# Patient Record
Sex: Female | Born: 2000 | Race: White | Hispanic: Yes | Marital: Single | Smoking: Former smoker
Health system: Southern US, Community
[De-identification: ages and names within clinical notes are randomized; demographics above are authoritative.]

## PROBLEM LIST (undated history)

## (undated) ENCOUNTER — Telehealth

## (undated) ENCOUNTER — Telehealth: Attending: Family | Primary: Family

## (undated) ENCOUNTER — Encounter

## (undated) ENCOUNTER — Ambulatory Visit

## (undated) ENCOUNTER — Telehealth
Attending: Student in an Organized Health Care Education/Training Program | Primary: Student in an Organized Health Care Education/Training Program

## (undated) ENCOUNTER — Encounter
Attending: Student in an Organized Health Care Education/Training Program | Primary: Student in an Organized Health Care Education/Training Program

## (undated) ENCOUNTER — Encounter: Attending: MS" | Primary: MS"

## (undated) ENCOUNTER — Ambulatory Visit: Payer: PRIVATE HEALTH INSURANCE

## (undated) ENCOUNTER — Ambulatory Visit: Attending: Physician Assistant | Primary: Physician Assistant

## (undated) ENCOUNTER — Ambulatory Visit: Payer: Medicaid (Managed Care) | Attending: Physician Assistant | Primary: Physician Assistant

## (undated) ENCOUNTER — Ambulatory Visit
Payer: Medicaid (Managed Care) | Attending: Student in an Organized Health Care Education/Training Program | Primary: Student in an Organized Health Care Education/Training Program

## (undated) ENCOUNTER — Ambulatory Visit
Payer: PRIVATE HEALTH INSURANCE | Attending: Student in an Organized Health Care Education/Training Program | Primary: Student in an Organized Health Care Education/Training Program

## (undated) ENCOUNTER — Ambulatory Visit: Payer: PRIVATE HEALTH INSURANCE | Attending: Surgery | Primary: Surgery

## (undated) ENCOUNTER — Ambulatory Visit: Attending: Pharmacist | Primary: Pharmacist

## (undated) ENCOUNTER — Encounter: Attending: Advanced Practice Midwife | Primary: Advanced Practice Midwife

## (undated) ENCOUNTER — Encounter: Attending: Pharmacist | Primary: Pharmacist

## (undated) ENCOUNTER — Ambulatory Visit: Payer: PRIVATE HEALTH INSURANCE | Attending: Registered" | Primary: Registered"

## (undated) ENCOUNTER — Encounter: Attending: Physician Assistant | Primary: Physician Assistant

## (undated) ENCOUNTER — Ambulatory Visit: Payer: MEDICAID

## (undated) ENCOUNTER — Ambulatory Visit: Payer: Medicaid (Managed Care)

## (undated) DIAGNOSIS — Z87440 Personal history of urinary (tract) infections: Secondary | ICD-10-CM

## (undated) DIAGNOSIS — Z862 Personal history of diseases of the blood and blood-forming organs and certain disorders involving the immune mechanism: Secondary | ICD-10-CM

## (undated) DIAGNOSIS — D649 Anemia, unspecified: Secondary | ICD-10-CM

## (undated) HISTORY — DX: Anemia, unspecified: D64.9

## (undated) HISTORY — PX: OTHER SURGICAL HISTORY: SHX169

## (undated) HISTORY — DX: Personal history of urinary (tract) infections: Z87.440

## (undated) HISTORY — PX: NECK SURGERY: SHX720

## (undated) HISTORY — DX: Personal history of diseases of the blood and blood-forming organs and certain disorders involving the immune mechanism: Z86.2

---
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
## 2019-01-11 ENCOUNTER — Other Ambulatory Visit: Payer: Self-pay

## 2019-01-11 ENCOUNTER — Ambulatory Visit (LOCAL_COMMUNITY_HEALTH_CENTER): Payer: Self-pay

## 2019-01-11 VITALS — BP 93/55 | Ht 63.0 in | Wt 163.0 lb

## 2019-01-11 DIAGNOSIS — Z3201 Encounter for pregnancy test, result positive: Secondary | ICD-10-CM

## 2019-01-11 LAB — PREGNANCY, URINE: Preg Test, Ur: POSITIVE — AB

## 2019-01-11 MED ORDER — PRENATAL VITAMIN 27-0.8 MG PO TABS: 1.0000 | ORAL_TABLET | Freq: Every day | ORAL | 0 refills | Status: AC

## 2019-01-11 NOTE — Progress Notes (Signed)
Client plans [facility], but unsure where will deliver baby and if delivers at [facility] unsure of practice. Also unsure of pharmacy of choice. PHQ-9 score = 19. Client reported several days of thinking she would be better off dead, but denied ever thinking about how she would make that happen. Given Milton Ferguson LCSW card and aware can call for appt and that no charge for appt. Dr. Ernestina Patches notified of above and in agreement with plan. Client states will try to come back this pm to complete preadmission paperwork and schedule MHC appt. Shona Needles, RN

## 2019-02-12 ENCOUNTER — Ambulatory Visit: Payer: Medicaid Other | Admitting: Advanced Practice Midwife

## 2019-02-12 ENCOUNTER — Other Ambulatory Visit: Payer: Self-pay

## 2019-02-12 VITALS — BP 102/57 | Temp 97.7°F | Wt 161.4 lb

## 2019-02-12 DIAGNOSIS — F329 Major depressive disorder, single episode, unspecified: Secondary | ICD-10-CM

## 2019-02-12 DIAGNOSIS — Z3401 Encounter for supervision of normal first pregnancy, first trimester: Secondary | ICD-10-CM

## 2019-02-12 DIAGNOSIS — Z3400 Encounter for supervision of normal first pregnancy, unspecified trimester: Secondary | ICD-10-CM | POA: Diagnosis not present

## 2019-02-12 DIAGNOSIS — F32A Depression, unspecified: Secondary | ICD-10-CM

## 2019-02-12 LAB — HIV ANTIBODY (ROUTINE TESTING W REFLEX): HIV 1&2 Ab, 4th Generation: NONREACTIVE

## 2019-02-12 LAB — URINALYSIS
Bilirubin, UA: NEGATIVE
Glucose, UA: NEGATIVE
Ketones, UA: NEGATIVE
Nitrite, UA: POSITIVE — AB
Protein,UA: NEGATIVE
Specific Gravity, UA: 1.02 (ref 1.005–1.030)
Urobilinogen, Ur: 0.2 mg/dL (ref 0.2–1.0)
pH, UA: 7 (ref 5.0–7.5)

## 2019-02-12 LAB — WET PREP FOR TRICH, YEAST, CLUE
Trichomonas Exam: NEGATIVE
Yeast Exam: NEGATIVE

## 2019-02-12 LAB — HEMOGLOBIN, FINGERSTICK: Hemoglobin: 11.2 g/dL (ref 11.1–15.9)

## 2019-02-12 NOTE — Progress Notes (Signed)
In for new ob; has PNV; initial labs today; c/o nausea with occ. Vomiting; phone interview intake completed 02/07/19 by Tawanna Solo, RN; Debera Lat, RN

## 2019-02-13 DIAGNOSIS — F32A Depression, unspecified: Secondary | ICD-10-CM | POA: Insufficient documentation

## 2019-02-13 DIAGNOSIS — F329 Major depressive disorder, single episode, unspecified: Secondary | ICD-10-CM | POA: Insufficient documentation

## 2019-02-13 NOTE — Progress Notes (Signed)
[facility] [address] [place] [id number] [phone]  INITIAL PRENATAL VISIT NOTE  Subjective:  Natalie Jenkins is a 18 y.o. G1P0 at 3169w3d being seen today to start prenatal care at the [facility].  She is currently monitored for the following issues for this low-risk pregnancy and does not have a problem list on file.  Patient reports no complaints.Contractions: Not present. Vag. Bleeding: None.  Movement: Absent. Denies leaking of fluid.   The following portions of the patient's history were reviewed and updated as appropriate: allergies, current medications, past family history, past medical history, past social history, past surgical history and problem list. Problem list updated.  Objective:   Vitals:   02/12/19 0924  BP: (!) 102/57  Temp: 97.7 F (36.5 C)  Weight: 161 lb 6.4 oz (73.2 kg)    Fetal Status: Fetal Heart Rate (bpm): 160 Fundal Height: 12 cm Movement: Absent     Physical Exam Constitutional:      Appearance: Normal appearance. She is normal weight.  HENT:     Head: Normocephalic and atraumatic.     Right Ear: External ear normal.     Left Ear: External ear normal.     Nose: Nose normal.     Mouth/Throat:     Mouth: Mucous membranes are moist.  Eyes:     Conjunctiva/sclera: Conjunctivae normal.  Neck:     Musculoskeletal: Neck supple.  Cardiovascular:     Rate and Rhythm: Normal rate and regular rhythm.  Pulmonary:     Effort: Pulmonary effort is normal.     Breath sounds: Normal breath sounds.  Abdominal:     General: Abdomen is flat.     Palpations: Abdomen is soft.     Tenderness: There is no abdominal tenderness.     Hernia: No hernia is present. There is no hernia in the left inguinal area or right inguinal area.     Comments: Soft abdomen without tenderness, fundus 3 FBaS, FHR=160  Genitourinary:    General: Normal vulva.     Exam  position: Lithotomy position.     Labia:        Right: No rash or lesion.        Left: No rash or lesion.      Vagina: Normal.     Cervix: Normal.     Uterus: Enlarged (enlarged 12 wks size).      Adnexa: Right adnexa normal and left adnexa normal.     Rectum: Normal.  Lymphadenopathy:     Lower Body: No right inguinal adenopathy. No left inguinal adenopathy.  Skin:    General: Skin is warm and dry.     Findings: Lesion (large scar at base of neck--lipoma removed 4 years ago in [place]) present.  Neurological:     Mental Status: She is alert and oriented to person, place, and time.  Psychiatric:        Mood and Affect: Mood normal.        Behavior: Behavior normal.     Assessment and Plan:  Pregnancy: G1P0 at 8869w3d. 18 yo SHF with FOB x 4 months and states pregnancy is "a surprise".  Living with FOB (who has an 98 yo child who lives with his mother) and pt came here from [place] 07/05/2017.  Working [employer]. LMP 11/25/2018.  Last ETOH 11/2018 (5 beers)  1. Encounter for supervision of normal first pregnancy in first trimester Pt c/o  N&V 1x/wk--last vomited yesterday.  Suggestions given. - Prenatal Profile with Varicella(282020) - HGB FRAC. W/SOLUBILITY - Lead, blood (adult age 42 yrs or greater) - QuantiFERON-TB Gold Plus - Urine Culture - Chlamydia/GC NAA, Confirmation - HIV [place] [facility] - 330076 Drug Screen - Hemoglobin, venipuncture - WET PREP FOR TRICH, YEAST, CLUE - Urinalysis - Hemoglobin, fingerstick  2. Depression, unspecified depression type PHq-9=13. +cry daily, decreased appetite, decreased sleep, +moody, decreased energy level, thoughts of not wanting to be here but neg SI/HI and no hx of.   Declines counseling with Milton Ferguson    Discussed overview of care and coordination with inpatient delivery practices including [facility], [facility], [facility] and [facility].   Reviewed Centering pregnancy as standard of care at [facility], oriented to room and showed video.  Based on EDD, plan for Cycle .    Preterm labor symptoms and general obstetric precautions including but not limited to vaginal bleeding, contractions, leaking of fluid and fetal movement were reviewed in detail with the patient.  Please refer to After Visit Summary for other counseling recommendations.   No follow-ups on file.  Future Appointments  Date Time Provider [place]  03/12/2019 10:00 AM [facility] PROVIDER [facility] None   Epic down from 0800-1400 on 02/12/19 so unable to document or access chart during visit.   Herbie Saxon, CNM

## 2019-02-14 ENCOUNTER — Encounter: Payer: Self-pay | Admitting: Advanced Practice Midwife

## 2019-02-14 DIAGNOSIS — O2340 Unspecified infection of urinary tract in pregnancy, unspecified trimester: Secondary | ICD-10-CM | POA: Insufficient documentation

## 2019-02-14 LAB — 789231 7+OXYCODONE-BUND
Amphetamines, Urine: NEGATIVE ng/mL
BENZODIAZ UR QL: NEGATIVE ng/mL
Barbiturate screen, urine: NEGATIVE ng/mL
Cannabinoid Quant, Ur: NEGATIVE ng/mL
Cocaine (Metab.): NEGATIVE ng/mL
OPIATE SCREEN URINE: NEGATIVE ng/mL
Oxycodone/Oxymorphone, Urine: NEGATIVE ng/mL
PCP Quant, Ur: NEGATIVE ng/mL

## 2019-02-14 LAB — URINE CULTURE

## 2019-02-15 ENCOUNTER — Other Ambulatory Visit: Payer: Self-pay

## 2019-02-15 ENCOUNTER — Ambulatory Visit: Payer: Medicaid Other | Admitting: Advanced Practice Midwife

## 2019-02-15 ENCOUNTER — Telehealth: Payer: Self-pay

## 2019-02-15 VITALS — BP 92/56 | Temp 98.6°F | Wt 159.0 lb

## 2019-02-15 DIAGNOSIS — Z283 Underimmunization status: Secondary | ICD-10-CM | POA: Insufficient documentation

## 2019-02-15 DIAGNOSIS — N3000 Acute cystitis without hematuria: Secondary | ICD-10-CM | POA: Diagnosis not present

## 2019-02-15 DIAGNOSIS — Z2839 Other underimmunization status: Secondary | ICD-10-CM | POA: Insufficient documentation

## 2019-02-15 DIAGNOSIS — O09899 Supervision of other high risk pregnancies, unspecified trimester: Secondary | ICD-10-CM | POA: Insufficient documentation

## 2019-02-15 DIAGNOSIS — Z3401 Encounter for supervision of normal first pregnancy, first trimester: Secondary | ICD-10-CM

## 2019-02-15 LAB — CBC/D/PLT+RPR+RH+ABO+RUB AB...
Antibody Screen: NEGATIVE
Basophils Absolute: 0.1 10*3/uL (ref 0.0–0.2)
Basos: 1 %
EOS (ABSOLUTE): 0.1 10*3/uL (ref 0.0–0.4)
Eos: 1 %
Hematocrit: 33 % — ABNORMAL LOW (ref 34.0–46.6)
Hemoglobin: 11.3 g/dL (ref 11.1–15.9)
Hepatitis B Surface Ag: NEGATIVE
Immature Grans (Abs): 0.1 10*3/uL (ref 0.0–0.1)
Immature Granulocytes: 1 %
Lymphocytes Absolute: 2 10*3/uL (ref 0.7–3.1)
Lymphs: 18 %
MCH: 31.3 pg (ref 26.6–33.0)
MCHC: 34.2 g/dL (ref 31.5–35.7)
MCV: 91 fL (ref 79–97)
Monocytes Absolute: 0.5 10*3/uL (ref 0.1–0.9)
Monocytes: 4 %
Neutrophils Absolute: 8.4 10*3/uL — ABNORMAL HIGH (ref 1.4–7.0)
Neutrophils: 75 %
Platelets: 273 10*3/uL (ref 150–450)
RBC: 3.61 x10E6/uL — ABNORMAL LOW (ref 3.77–5.28)
RDW: 12.4 % (ref 11.7–15.4)
RPR Ser Ql: NONREACTIVE
Rh Factor: POSITIVE
Rubella Antibodies, IGG: <0.9 index — ABNORMAL LOW (ref >0.99)
Varicella zoster IgG: 196 index (ref >165)
WBC: 11 10*3/uL — ABNORMAL HIGH (ref 3.4–10.8)

## 2019-02-15 LAB — QUANTIFERON-TB GOLD PLUS
QuantiFERON Mitogen Value: 1.33 IU/mL
QuantiFERON Nil Value: 0.01 IU/mL
QuantiFERON TB1 Ag Value: 0.02 IU/mL
QuantiFERON TB2 Ag Value: 0.02 IU/mL
QuantiFERON-TB Gold Plus: NEGATIVE

## 2019-02-15 LAB — LEAD, BLOOD (ADULT >= 16 YRS): Lead-Whole Blood: NOT DETECTED ug/dL (ref 0–4)

## 2019-02-15 LAB — HGB FRAC. W/SOLUBILITY
Hgb A2 Quant: 2.2 % (ref 1.8–3.2)
Hgb A: 97.8 % (ref 96.4–98.8)
Hgb C: 0 %
Hgb F Quant: 0 % (ref 0.0–2.0)
Hgb S: 0 %
Hgb Solubility: NEGATIVE
Hgb Variant: 0 %

## 2019-02-15 LAB — CHLAMYDIA/GC NAA, CONFIRMATION
Chlamydia trachomatis, NAA: NEGATIVE
Neisseria gonorrhoeae, NAA: NEGATIVE

## 2019-02-15 MED ORDER — NITROFURANTOIN MONOHYD MACRO 100 MG PO CAPS: 100.0000 mg | ORAL_CAPSULE | Freq: Two times a day (BID) | ORAL | 0 refills | Status: DC

## 2019-02-15 NOTE — Telephone Encounter (Signed)
Call to client due to +UTI, per E. Sciora, CNM-discussed need for Tx; scheduled appt. Today via interpreter [place]. [place] Rami Budhu, RN

## 2019-02-15 NOTE — Progress Notes (Signed)
   PRENATAL VISIT NOTE  Subjective:  Natalie Jenkins is a 18 y.o. G1P0 at [redacted]w[redacted]d being seen today for treatment of UTI only.  She is currently monitored for the following issues for this low-risk pregnancy and has Depression; UTI (urinary tract infection); and Rubella non-immune status, antepartum on their problem list.  Patient reports no complaints.  Denies leaking of fluid/ROM.   The following portions of the patient's history were reviewed and updated as appropriate: allergies, current medications, past family history, past medical history, past social history, past surgical history and problem list. Problem list updated.  Objective:   Vitals:   02/15/19 1603  BP: (!) 92/56  Temp: 98.6 F (37 C)  Weight: 159 lb (72.1 kg)    Fetal Status:           General:  Alert, oriented and cooperative. Patient is in no acute distress.  Skin: Skin is warm and dry. No rash noted.   Cardiovascular: Normal heart rate noted  Respiratory: Normal respiratory effort, no problems with respiration noted  Abdomen: Soft, gravid, appropriate for gestational age.        Pelvic: Cervical exam deferred        Extremities: Normal range of motion.     Mental Status: Normal mood and affect. Normal behavior. Normal judgment and thought content.   Assessment and Plan:  Pregnancy: G1P0 at [redacted]w[redacted]d  1. Acute cystitis without hematuria >100,000 E. Coli on 02/12/19.  Drinks 3 c. Coffee/wk and 21 oz coke/day--counseled to stop drinking Given Macrobid 100 mg po BID x 7 days with instructions.  -CVAT   Preterm labor symptoms and general obstetric precautions including but not limited to vaginal bleeding, contractions, leaking of fluid and fetal movement were reviewed in detail with the patient. Please refer to After Visit Summary for other counseling recommendations.  Return for has appt already made--03/12/19.  Future Appointments  Date Time Provider [place]  03/12/2019 10:00 AM [facility] PROVIDER  [facility] None    Herbie Saxon, CNM

## 2019-02-15 NOTE — Addendum Note (Signed)
Addended by: Donnal Moat on: 02/15/2019 05:03 PM   Modules accepted: Orders

## 2019-02-15 NOTE — Progress Notes (Signed)
In for UTI Tx Talayla Doyel, RN  

## 2019-02-18 ENCOUNTER — Encounter: Payer: Self-pay | Admitting: Advanced Practice Midwife

## 2019-02-18 DIAGNOSIS — Z3400 Encounter for supervision of normal first pregnancy, unspecified trimester: Secondary | ICD-10-CM | POA: Insufficient documentation

## 2019-02-22 ENCOUNTER — Emergency Department: Admit: 2019-02-22 | Discharge: 2019-02-23 | Disposition: A | Discharge status: Home / Self Care | Payer: MEDICAID

## 2019-02-22 ENCOUNTER — Ambulatory Visit: Admit: 2019-02-22 | Discharge: 2019-02-23 | Disposition: A | Discharge status: Home / Self Care | Payer: MEDICAID

## 2019-02-22 DIAGNOSIS — R102 Pelvic and perineal pain: Secondary | ICD-10-CM

## 2019-02-22 DIAGNOSIS — Z3A12 12 weeks gestation of pregnancy: Secondary | ICD-10-CM

## 2019-02-22 DIAGNOSIS — O219 Vomiting of pregnancy, unspecified: Secondary | ICD-10-CM

## 2019-02-22 DIAGNOSIS — O210 Mild hyperemesis gravidarum: Secondary | ICD-10-CM

## 2019-02-22 DIAGNOSIS — O9989 Other specified diseases and conditions complicating pregnancy, childbirth and the puerperium: Secondary | ICD-10-CM

## 2019-03-07 NOTE — Addendum Note (Signed)
Addended by: Cletis Media on: 03/07/2019 11:30 AM   Modules accepted: Orders

## 2019-03-12 ENCOUNTER — Ambulatory Visit: Payer: Medicaid Other | Admitting: Advanced Practice Midwife

## 2019-03-12 ENCOUNTER — Other Ambulatory Visit: Payer: Self-pay

## 2019-03-12 ENCOUNTER — Encounter: Payer: Self-pay | Admitting: Advanced Practice Midwife

## 2019-03-12 DIAGNOSIS — O2340 Unspecified infection of urinary tract in pregnancy, unspecified trimester: Secondary | ICD-10-CM

## 2019-03-12 DIAGNOSIS — Z3402 Encounter for supervision of normal first pregnancy, second trimester: Secondary | ICD-10-CM

## 2019-03-12 DIAGNOSIS — Z3400 Encounter for supervision of normal first pregnancy, unspecified trimester: Secondary | ICD-10-CM | POA: Diagnosis not present

## 2019-03-12 NOTE — Progress Notes (Addendum)
   TELEPHONE OBSTETRICS VISIT ENCOUNTER NOTE  I connected with Natalie Jenkins @ on 03/12/19 at 10:00 AM EDT by telephone at home and verified that I am speaking with the correct person using two identifiers.   I discussed the limitations, risks, security and privacy concerns of performing an evaluation and management service by telephone and the availability of in person appointments. I also discussed with the patient that there may be a patient responsible charge related to this service. The patient expressed understanding and agreed to proceed.  Subjective:  Natalie Jenkins is a 18 y.o. G1P0 at [redacted]w[redacted]d being followed for ongoing prenatal care.  She is currently monitored for the following issues for this low-risk pregnancy and has Depression; UTI (urinary tract infection) in pregnancy, antepartum; Rubella non-immune status, antepartum; and Supervision of low-risk first pregnancy on their problem list.  Patient reports sl pelvic pain after work, walking a lot, or stairclimbing. Reports fetal movement. Denies any contractions, bleeding or leaking of fluid.  Also states she ate eggs and broke out in pruritic rash on face and chest as a result.  Not applying anything for relief but accepts appt tomorrow in clinic for evaluation  The following portions of the patient's history were reviewed and updated as appropriate: allergies, current medications, past family history, past medical history, past social history, past surgical history and problem list.   Objective:   General:  Alert, oriented and cooperative.   Mental Status: Normal mood and affect perceived. Normal judgment and thought content.  Rest of physical exam deferred due to type of encounter  Assessment and Plan:  Pregnancy: G1P0 at [redacted]w[redacted]d 1. Encounter for supervision of low-risk first pregnancy in second trimester States went to [facility] ER 2-3 weeks ago for lower pelvis pain and given B6 and "sleep ID" which relieved.   Plans  to receive Peds sheet from [place] at next appt.   Less nauseated now.  Occassional h/a in pm's--counseled to increase fluids.  Depression--pt states feels "better, with more energy than before".  Accepted LCSW counseling and states would call if needs  2. UTI (urinary tract infection) in pregnancy, antepartum Pt states finished antibiotics and will receive TOC at next appt  Preterm labor symptoms and general obstetric precautions including but not limited to vaginal bleeding, contractions, leaking of fluid and fetal movement were reviewed in detail with the patient.  I discussed the assessment and treatment plan with the patient. The patient was provided an opportunity to ask questions and all were answered. The patient agreed with the plan and demonstrated an understanding of the instructions. The patient was advised to call back or seek an in-person office evaluation/go to the hospital for any urgent or concerning symptoms.  Please refer to After Visit Summary for other counseling recommendations.   I provided 29:46 minutes of non-face-to-face time during this encounter.  Return in about 4 weeks (around 04/09/2019).  Future Appointments  Date Time Provider [place]  03/13/2019  8:20 AM [facility] PROVIDER [facility] None  04/09/2019  8:40 AM [facility] PROVIDER [facility] None    Herbie Saxon, CNM

## 2019-03-12 NOTE — Progress Notes (Signed)
Phone call to initiate telehealth appt and verified speaking with client using 2 identifiers. Client states at home and okay to proceed with visit. Documented weight obtained this am by client (does not have BP cuff or thermometer at home). Completed treatment for UTI (TOC next RV). Negative responses to Covid-19 screening questions and denies international travel for self or FOB since pregnant. Taking PNV QD. 4 week MHC RV appt scheduled.Rich Number, RN

## 2019-03-13 ENCOUNTER — Encounter
Admit: 2019-03-13 | Discharge: 2019-03-13 | Payer: PRIVATE HEALTH INSURANCE | Attending: Advanced Practice Midwife | Primary: Advanced Practice Midwife

## 2019-03-13 ENCOUNTER — Ambulatory Visit: Payer: Medicaid Other | Admitting: Advanced Practice Midwife

## 2019-03-13 ENCOUNTER — Other Ambulatory Visit: Payer: Self-pay

## 2019-03-13 VITALS — BP 96/66 | Temp 97.1°F | Wt 165.6 lb

## 2019-03-13 DIAGNOSIS — Z3689 Encounter for other specified antenatal screening: Secondary | ICD-10-CM

## 2019-03-13 DIAGNOSIS — Z283 Underimmunization status: Secondary | ICD-10-CM | POA: Diagnosis not present

## 2019-03-13 DIAGNOSIS — O9989 Other specified diseases and conditions complicating pregnancy, childbirth and the puerperium: Secondary | ICD-10-CM

## 2019-03-13 DIAGNOSIS — O2340 Unspecified infection of urinary tract in pregnancy, unspecified trimester: Secondary | ICD-10-CM

## 2019-03-13 DIAGNOSIS — Z3402 Encounter for supervision of normal first pregnancy, second trimester: Secondary | ICD-10-CM

## 2019-03-13 DIAGNOSIS — Z2839 Other underimmunization status: Secondary | ICD-10-CM

## 2019-03-13 DIAGNOSIS — Z3400 Encounter for supervision of normal first pregnancy, unspecified trimester: Secondary | ICD-10-CM

## 2019-03-13 NOTE — Progress Notes (Signed)
[facility] [place] referral faxed with confirmation received and client aware they will notify her of appt via phone call. Rich Number, RN

## 2019-03-13 NOTE — Progress Notes (Signed)
   PRENATAL VISIT NOTE  Subjective:  Natalie Jenkins is a 18 y.o. G1P0 at [redacted]w[redacted]d being seen today for ongoing prenatal care.  She is currently monitored for the following issues for this low-risk pregnancy and has Depression; UTI (urinary tract infection) in pregnancy, antepartum; Rubella non-immune status, antepartum; and Supervision of low-risk first pregnancy on their problem list.  Patient reports "rash"on cheeks under eyes after eating 3-4 eggs/day x 3 days 3 weeks ago.  Three days later rash appeared on cheeks, nonpruritic then resolved. Rash free x 1 week,  and then began using new "push up bra"and "rash" appeared posterior to alveola bilaterally that itched. Hydrocortisone cream relieving.  Also c/o itchy shoulder blades--no rash .  Contractions: Not present. Vag. Bleeding: None.  Movement: Absent. Denies leaking of fluid/ROM.   The following portions of the patient's history were reviewed and updated as appropriate: allergies, current medications, past family history, past medical history, past social history, past surgical history and problem list. Problem list updated.  Objective:   Vitals:   03/13/19 0900  BP: 96/66  Temp: (!) 97.1 F (36.2 C)  Weight: 165 lb 9.6 oz (75.1 kg)    Fetal Status: Fetal Heart Rate (bpm): 130   Movement: Absent     General:  Alert, oriented and cooperative. Patient is in no acute distress.  Skin: Skin is warm and dry. No rash noted.   Cardiovascular: Normal heart rate noted  Respiratory: Normal respiratory effort, no problems with respiration noted  Abdomen: Soft, gravid, appropriate for gestational age.  Pain/Pressure: Absent     Pelvic: Cervical exam deferred        Extremities: Normal range of motion.  Edema: None  Mental Status: Normal mood and affect. Normal behavior. Normal judgment and thought content.   Assessment and Plan:  Pregnancy: G1P0 at [redacted]w[redacted]d   1. UTI (urinary tract infection) in pregnancy, antepartum TOC today-- Urine  Culture  2. Supervision of normal first pregnancy, antepartum Quad screen today.  Anatomy u/s ordered for 20 wks - QUAD Screen [facility] Only  3.  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 Pt also states itching is worse when she drinks milk and eats cheese.  "Rash" on posterior aspect of breasts coincides with new use of "push up bra" with underwire.  Pt shows me picture of breasts and it appears to be due to trauma of underwire.  Counseled not to wear underwire bras.  Pt poor historian so unable to give full history and appears confused about questioning re: detergent, new clothes worn, etc.  Doubt egg allergy but counseled pt to avoid eggs, milk, cheese, until her skin issue has resolved and then introduce 1 egg and see what happens.  May need to refer to derm if not resolved.  Counseled not to use underwire bra during pregnancy and limit detergent in clothes and double rinse clothes, no dryer sheet, etc.     Preterm labor symptoms and general obstetric precautions including but not limited to vaginal bleeding, contractions, leaking of fluid and fetal movement were reviewed in detail with the patient. Please refer to After Visit Summary for other counseling recommendations.  Return in about 4 weeks (around 04/10/2019).  Future Appointments  Date Time Provider [place]  04/09/2019  8:40 AM [facility] PROVIDER [facility] None    Herbie Saxon, CNM

## 2019-03-13 NOTE — Progress Notes (Signed)
MHC problem visit - c/o rash on chest. Completed UTI antibiotic - TOC today.Taking PNV QD. Negative responses to Covid-19 screening questions. Counseled re: Quad screen and test desired.  Rich Number, RN

## 2019-03-15 DIAGNOSIS — Z3482 Encounter for supervision of other normal pregnancy, second trimester: Secondary | ICD-10-CM

## 2019-03-15 LAB — URINE CULTURE: Organism ID, Bacteria: NO GROWTH

## 2019-04-01 ENCOUNTER — Telehealth: Payer: Self-pay | Admitting: Family Medicine

## 2019-04-01 NOTE — Telephone Encounter (Signed)
PATIENT WANTS LETTER FOR WORK TO WORK LESS HOURS AND FOR HER TO BE STATIONED AT THE SAME AREA. PLEASE CALL BACK TO GET MORE INFO

## 2019-04-02 NOTE — Telephone Encounter (Signed)
Client desires work note to work less hours and to be stationed in area that is physically easier for her. Client employed at [employer] and working 7 days per week on 3 pm - 11 pm shift with one 20 min break first part of shift, 30 minute dinner break and 15 minute break second part of shift. Job involves standing. Client works between two areas. 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 Encouraged client to discuss above with provider at 04/09/2019 appt, but desires letter in advance of that appt. Counseled client that provider would most likely write to encourage no more than 40 hours worked per week, but most likely would not write to specify area she should work in (in absence of medical reason to do so). Counseled above info would be sent to provider and she will receive call when note available for pick up. Rich Number, RN

## 2019-04-06 ENCOUNTER — Ambulatory Visit: Admit: 2019-04-06 | Discharge: 2019-04-06

## 2019-04-09 ENCOUNTER — Ambulatory Visit: Payer: Self-pay

## 2019-04-10 ENCOUNTER — Ambulatory Visit: Payer: Self-pay

## 2019-04-11 ENCOUNTER — Ambulatory Visit: Payer: Self-pay | Admitting: Physician Assistant

## 2019-04-11 ENCOUNTER — Encounter: Payer: Self-pay | Admitting: Physician Assistant

## 2019-04-11 ENCOUNTER — Other Ambulatory Visit: Payer: Self-pay

## 2019-04-11 DIAGNOSIS — Z3402 Encounter for supervision of normal first pregnancy, second trimester: Secondary | ICD-10-CM

## 2019-04-11 NOTE — Progress Notes (Signed)
Patient here for MH RV at 19 4/7. Patient states she was unable to go to 04/05/2019 U/S at [facility]. States she would like to reschedule, and could be available for morning appointment. She states she couldn't afford the $150 deposit at that time.Marland KitchenMarland KitchenJenetta Downer, RN

## 2019-04-11 NOTE — Progress Notes (Signed)
   PRENATAL VISIT NOTE  Subjective:  Natalie Jenkins is a 18 y.o. G1P0 at [redacted]w[redacted]d being seen today for ongoing prenatal care.  She is currently monitored for the following issues for this low-risk pregnancy and has Depression; UTI (urinary tract infection) in pregnancy, antepartum; Rubella non-immune status, antepartum; and Supervision of low-risk first pregnancy on their problem list.  Patient reports no complaints.  Contractions: Not present. Vag. Bleeding: None.  Movement: Present. Denies leaking of fluid/ROM.   The following portions of the patient's history were reviewed and updated as appropriate: allergies, current medications, past family history, past medical history, past social history, past surgical history and problem list. Problem list updated.  Objective:   Vitals:   04/11/19 1003  BP: 96/60  Temp: (!) 97.1 F (36.2 C)  Weight: 170 lb 6.4 oz (77.3 kg)    Fetal Status: Fetal Heart Rate (bpm): 152 Fundal Height: 19 cm Movement: Present     General:  Alert, oriented and cooperative. Patient is in no acute distress.  Skin: Skin is warm and dry. No rash noted.   Cardiovascular: Normal heart rate noted  Respiratory: Normal respiratory effort, no problems with respiration noted  Abdomen: Soft, gravid, appropriate for gestational age.  Pain/Pressure: Absent     Pelvic: Cervical exam deferred        Extremities: Normal range of motion.  Edema: None  Mental Status: Normal mood and affect. Normal behavior. Normal judgment and thought content.   Assessment and Plan:  Pregnancy: G1P0 at [redacted]w[redacted]d  1. Encounter for supervision of low-risk first pregnancy in second trimester Vomiting has resolved, feels well. Counseled that UTI resolved per 03/13/19 U C&S. Agrees to telehealth RV in 4 weeks. Phone number has changed, but she does not know the number - she will call the clinic with this info. Discussed anticipated work restrictions later in pregnancy (limit to 8h/shift, lifting  restrictions). Plan to reschedule anat [place] - pt to call.   Preterm labor symptoms and general obstetric precautions including but not limited to vaginal bleeding, contractions, leaking of fluid and fetal movement were reviewed in detail with the patient. Please refer to After Visit Summary for other counseling recommendations.  Return in about 4 weeks (around 05/09/2019) for Routine prenatal care, Telehealth.  No future appointments.  Lora Havens, PA-C

## 2019-04-14 ENCOUNTER — Other Ambulatory Visit: Payer: Self-pay

## 2019-04-14 ENCOUNTER — Encounter: Payer: Self-pay | Admitting: Emergency Medicine

## 2019-04-14 DIAGNOSIS — Z79899 Other long term (current) drug therapy: Secondary | ICD-10-CM | POA: Insufficient documentation

## 2019-04-14 DIAGNOSIS — O99891 Other specified diseases and conditions complicating pregnancy: Secondary | ICD-10-CM | POA: Diagnosis present

## 2019-04-14 DIAGNOSIS — M25551 Pain in right hip: Secondary | ICD-10-CM | POA: Diagnosis not present

## 2019-04-14 DIAGNOSIS — Y939 Activity, unspecified: Secondary | ICD-10-CM | POA: Diagnosis not present

## 2019-04-14 DIAGNOSIS — Z3A19 19 weeks gestation of pregnancy: Secondary | ICD-10-CM | POA: Insufficient documentation

## 2019-04-14 DIAGNOSIS — M25561 Pain in right knee: Secondary | ICD-10-CM | POA: Insufficient documentation

## 2019-04-14 DIAGNOSIS — M79621 Pain in right upper arm: Secondary | ICD-10-CM | POA: Insufficient documentation

## 2019-04-14 DIAGNOSIS — W010XXA Fall on same level from slipping, tripping and stumbling without subsequent striking against object, initial encounter: Secondary | ICD-10-CM | POA: Diagnosis not present

## 2019-04-14 DIAGNOSIS — Y929 Unspecified place or not applicable: Secondary | ICD-10-CM | POA: Insufficient documentation

## 2019-04-14 DIAGNOSIS — Y990 Civilian activity done for income or pay: Secondary | ICD-10-CM | POA: Diagnosis not present

## 2019-04-14 NOTE — ED Notes (Signed)
Lost Fabio on [employer] and had to restart with new interpreter; [phone]

## 2019-04-14 NOTE — ED Notes (Signed)
L&D called att to consult for pt, per  Charge RN calculation pt is not eligible to be seen at L&D

## 2019-04-14 NOTE — ED Triage Notes (Addendum)
[employer] interpreter [phone]: Pt says she slipped at work and fell tonight; landed an her right knee and then fell all the way to the floor; pt c/o pain to right knee, right bicep area and along her right waistline/hip area; pt says her hip and knee are both constantly hurting; ambulatory with steady gait/no difficulty; pt is [redacted]w[redacted]d pregnant; pt says she has felt the baby move in the last 30 minutes

## 2019-04-15 ENCOUNTER — Emergency Department
Admission: EM | Admit: 2019-04-15 | Discharge: 2019-04-15 | Disposition: A | Discharge status: Home / Self Care | Payer: No Typology Code available for payment source | Attending: Emergency Medicine | Admitting: Emergency Medicine

## 2019-04-15 DIAGNOSIS — M7918 Myalgia, other site: Secondary | ICD-10-CM

## 2019-04-15 DIAGNOSIS — W19XXXA Unspecified fall, initial encounter: Secondary | ICD-10-CM

## 2019-04-15 DIAGNOSIS — Z3492 Encounter for supervision of normal pregnancy, unspecified, second trimester: Secondary | ICD-10-CM

## 2019-04-15 NOTE — ED Notes (Addendum)
Pt already assessed by this RN and EDP  Pt fell at work (Melody completed worker's comp) pain to right knee  Pt denies pain in abdomen, reports feeling baby move recently, able to eat, pt able to move right knee without too much pain, ambulatory without distress

## 2019-04-15 NOTE — Discharge Instructions (Signed)
You have been seen in the Emergency Department (ED) today for a fall.  Your work up does not show any concerning injuries.  Please take over-the-counter Tylenol as needed for your pain (unless you have an allergy or your doctor as told you not to take them).  Please follow up with your doctor regarding today's Emergency Department (ED) visit and your recent fall.    Return to the ED if you have any headache, confusion, slurred speech, weakness/numbness of any arm or leg, or any increased pain.

## 2019-04-15 NOTE — ED Notes (Signed)
No peripheral IV placed this visit.   Discharge instructions reviewed with patient. Questions fielded by this RN. Patient verbalizes understanding of instructions. Patient discharged home in stable condition per forbach. No acute distress noted at time of discharge.   Pt has worker's comp paperwork in her hand. Shown to lobby. Pain control for pregnancy discussed.    [employer] interpreter used for DC

## 2019-04-15 NOTE — ED Provider Notes (Signed)
[facility] Emergency Department Provider Note  ____________________________________________   First MD Initiated Contact with Patient 04/15/19 0012     (approximate)  I have reviewed the triage vital signs and the nursing notes.   HISTORY  Chief Complaint Fall   The patient and/or family speak(s) Spanish.  They understand they have the right to the use of a hospital interpreter, however at this time they prefer to speak directly with me in Spanish.  They know that they can ask for an interpreter at any time.   HPI Natalie Jenkins is a 18 y.o. female with no contributory past medical history except for the fact that she is between 80 and [redacted] weeks pregnant who presents for evaluation after a fall.  She reportedly was at work when she slipped and fell.  She reports that she first landed on her right knee and then fell to the floor but caught herself with her right arm so that she did not strike her abdomen.  She has had no pain in her abdomen and no vaginal bleeding since the fall.  She reports having pain in the right knee as well as in the right bicep area and along her right hip.   She is ambulating without any difficulty.  She reports no pain when she bears weight or walks.  She did not strike her head.  She has no pain in her neck.  She is in no distress and reports that the pain is mild.  She has no swelling.  She says that she can feel the baby moving around since coming to the emergency department.        Past Medical History:  Diagnosis Date  . Anemia    As a child  . History of anemia    age 61 years  . History of kidney infection    as child    Patient Active Problem List   Diagnosis Date Noted  . Supervision of low-risk first pregnancy 02/18/2019  . Rubella non-immune status, antepartum 02/15/2019  . UTI (urinary tract infection) in pregnancy, antepartum 02/14/2019  . Depression 02/13/2019    Past Surgical History:  Procedure  Laterality Date  . Cervical lipoma  2018  . superior cervical lipoma removal     2018 in [place], Did not go back repeat evaluation as came to [place].    Prior to Admission medications   Medication Sig Start Date End Date Taking? Authorizing Provider  nitrofurantoin, macrocrystal-monohydrate, (MACROBID) 100 MG capsule Take 1 capsule (100 mg total) by mouth 2 (two) times daily. Patient not taking: Reported on 03/12/2019 02/15/19   Alberteen Spindle, CNM  Prenatal Vit-Fe Fumarate-FA (PRENATAL VITAMIN) 27-0.8 MG TABS Take 1 tablet by mouth daily at 6 (six) AM. 01/11/19   Federico Flake, MD    Allergies Patient has no known allergies.  Family History  Problem Relation Age of Onset  . Heart disease Maternal Grandmother     Social History Social History   Tobacco Use  . Smoking status: Never Smoker  . Smokeless tobacco: Never Used  Substance Use Topics  . Alcohol use: Not Currently    Comment: Last ETOH use 11/2018 prior to known pregnancy.  . Drug use: Never    Review of Systems Constitutional: No fever/chills Cardiovascular: Denies chest pain. Respiratory: Denies shortness of breath. Gastrointestinal: No abdominal pain.  No nausea, no vomiting.   Genitourinary: Negative for vaginal bleeding.  Negative for dysuria. Musculoskeletal: Mild pain in right knee,  right hip, and right upper arm. Integumentary: Negative for lacerations. Neurological: Negative for headaches, focal weakness or numbness.   ____________________________________________   PHYSICAL EXAM:  VITAL SIGNS: ED Triage Vitals  Enc Vitals Group     BP 04/14/19 2222 107/62     Pulse Rate 04/14/19 2222 93     Resp 04/14/19 2222 17     Temp --      Temp src --      SpO2 04/14/19 2222 99 %     Weight 04/14/19 2223 77.1 kg (170 lb)     Height 04/14/19 2223 1.651 m (5\' 5" )     Head Circumference --      Peak Flow --      Pain Score 04/14/19 2221 7     Pain Loc --      Pain Edu? --      Excl. in [place]?  --     Constitutional: Alert and oriented.  Well-appearing and in no distress. Eyes: Conjunctivae are normal.  Head: Atraumatic. Cardiovascular: Normal rate, regular rhythm. Good peripheral circulation. Grossly normal heart sounds. Respiratory: Normal respiratory effort.  No retractions. Gastrointestinal: Soft and nontender.  Abdomen appears consistent with reported 19 to 20-week pregnancy. Genitourinary: Deferred Musculoskeletal: No lower extremity tenderness nor edema. No gross deformities of extremities.  No tenderness to palpation or with ambulation or active or passive flexion, extension, and rotation of right arm and right leg.  Mild tenderness to palpation of the right hip.  No joint effusions. Neurologic:  Normal speech and language. No gross focal neurologic deficits are appreciated.  Skin:  Skin is warm, dry and intact. Psychiatric: Mood and affect are normal. Speech and behavior are normal.  ____________________________________________   LABS (all labs ordered are listed, but only abnormal results are displayed)  Labs Reviewed - No data to display ____________________________________________  EKG  No indication for EKG ____________________________________________  RADIOLOGY I, Hinda Kehr, personally viewed and evaluated these images (plain radiographs) as part of my medical decision making, as well as reviewing the written report by the radiologist.  ED MD interpretation: No indication for emergent imaging  Official radiology report(s): No results found.  ____________________________________________   PROCEDURES   Procedure(s) performed (including Critical Care):  Procedures   ____________________________________________   INITIAL IMPRESSION / MDM / [place] / ED COURSE  As part of my medical decision making, I reviewed the following data within the [place] notes reviewed and incorporated, Old chart reviewed,  Notes from prior ED visits and [place] Controlled Substance Database   Differential diagnosis includes, but is not limited to, musculoskeletal contusion, fracture/dislocation, fetal injury.  The patient is well-appearing, no acute distress, normal vital signs, and has no abdominal pain.  She can feel the baby moving and has had no vaginal bleeding.  L&D was consulted about the possibility of the patient going up to their floor for observation and they declined saying that the patient was not eligible to be seen at L&D based on her gestational age.  The patient is very clear that she was careful to avoid landing on her abdomen and she has no signs or symptoms of abdominal injury and I think it is acceptable and appropriate that she not go to L&D for additional fetal monitoring.  She has been in the emergency department about 3 hours and the injury occurred on unspecified amount of time prior to the arrival to the ED and her work-up is reassuring.  She says that  she would like to return to work tomorrow and I see no reason that she cannot do so.  I have provided a return to work note and also encouraged her to follow-up with her OB/GYN.  Fetal heart tones also observed and documented at bedside.  Gave usual/customary return precautions.          ____________________________________________  FINAL CLINICAL IMPRESSION(S) / ED DIAGNOSES  Final diagnoses:  Fall, initial encounter  Musculoskeletal pain  Second trimester pregnancy     MEDICATIONS GIVEN DURING THIS VISIT:  Medications - No data to display   ED Discharge Orders    None      *Please note:  Amedeo GoryLuzmery Morazan Ortega was evaluated in Emergency Department on 04/15/2019 for the symptoms described in the history of present illness. She was evaluated in the context of the global COVID-19 pandemic, which necessitated consideration that the patient might be at risk for infection with the SARS-CoV-2 virus that causes COVID-19. Institutional  protocols and algorithms that pertain to the evaluation of patients at risk for COVID-19 are in a state of rapid change based on information released by regulatory bodies including the CDC and federal and state organizations. These policies and algorithms were followed during the patient's care in the ED.  Some ED evaluations and interventions may be delayed as a result of limited staffing during the pandemic.*  Note:  This document was prepared using Dragon voice recognition software and may include unintentional dictation errors.   Loleta RoseForbach, Evalina Tabak, MD 04/15/19 [phone]

## 2019-05-09 ENCOUNTER — Other Ambulatory Visit: Payer: Self-pay

## 2019-05-09 ENCOUNTER — Ambulatory Visit: Payer: Self-pay | Admitting: Physician Assistant

## 2019-05-09 ENCOUNTER — Encounter: Payer: Self-pay | Admitting: Physician Assistant

## 2019-05-09 DIAGNOSIS — Z3400 Encounter for supervision of normal first pregnancy, unspecified trimester: Secondary | ICD-10-CM

## 2019-05-09 NOTE — Progress Notes (Signed)
TC to patient for [facility] telehealth appointment. Patient identity verified. Patient states she is at home. Patient fell and went to ED on 04/15/2019, states her hips hurt, but denies any other pain, denies discharge, bleeding related to fall, and states baby is moving well. Patient [facility] for [facility] U[facility]S on 04/05/2019 due to cost. Patient states that [facility] never called her to reschedule. Patient given [facility] U/S scheduling number to call and reschedule and told to come to [facility] clinic tomorrow to pick up low cost U/S letter for U/S at [facility]. Patient states understanding and agrees to come for letter and to call [facility] to reschedule.Jenetta Downer, RN

## 2019-05-09 NOTE — Progress Notes (Signed)
   TELEPHONE OBSTETRICS VISIT ENCOUNTER NOTE  I connected with@ on 05/09/19 at 10:20 AM EST by telephone at home and verified that I am speaking with the correct person using two identifiers.   I discussed the limitations, risks, security and privacy concerns of performing an evaluation and management service by telephone and the availability of in person appointments. I also discussed with the patient that there may be a patient responsible charge related to this service. The patient expressed understanding and agreed to proceed.  Subjective:  Natalie Jenkins is a 18 y.o. G1P0 at [redacted]w[redacted]d being followed for ongoing prenatal care.  She is currently monitored for the following issues for this low-risk pregnancy and has Depression; UTI (urinary tract infection) in pregnancy, antepartum; Rubella non-immune status, antepartum; and Supervision of low-risk first pregnancy on their problem list.  Patient reports ongoing right hip pain since she fell onto that hip 2-3 weeks ago. Pain is somewhat improved, feels better when "resting", worse with movement. Also has occ low back pain. Not taking any pain meds. Was out of work for 1 week, but has been working this week.. Reports fetal movement. Denies any contractions, bleeding or leaking of fluid.   The following portions of the patient's history were reviewed and updated as appropriate: allergies, current medications, past family history, past medical history, past social history, past surgical history and problem list.   Objective:   General:  Alert, oriented and cooperative.   Mental Status: Normal mood and affect perceived. Normal judgment and thought content.  Rest of physical exam deferred due to type of encounter  Assessment and Plan:  Pregnancy: G1P0 at [redacted]w[redacted]d 1. Encounter for supervision of low-risk first pregnancy, antepartum Pt is encouraged to reschedule her missed anat [place] appt (will pick up financial form from [facility] tomorrow so her  out-of-pocket cost at [facility] should only be $10.)  For her hip and back pain, she is encouraged to use local ice and 2 acetaminophen tabs every 4-6 hours over the next 3 days. She should let us know if her pain is worsening. Also encouraged gentle walking. Anticipatory guidance re: 28-wk labs at clinic RV.  Preterm labor symptoms and general obstetric precautions including but not limited to vaginal bleeding, contractions, leaking of fluid and fetal movement were reviewed in detail with the patient.  I discussed the assessment and treatment plan with the patient. The patient was provided an opportunity to ask questions and all were answered. The patient agreed with the plan and demonstrated an understanding of the instructions. The patient was advised to call back or seek an in-person office evaluation/go to the hospital for any urgent or concerning symptoms.  Please refer to After Visit Summary for other counseling recommendations.   I provided 14 minutes of non-face-to-face time during this encounter.  Return in about 4 weeks (around 06/06/2019) for 28 wk labs, Routine prenatal care.  Future Appointments  Date Time Provider [place]  06/06/2019  1:20 PM AC-MH PROVIDER AC-MAT None    Lora Havens, PA-C [facility]

## 2019-06-06 ENCOUNTER — Encounter: Payer: Self-pay | Admitting: Physician Assistant

## 2019-06-06 ENCOUNTER — Ambulatory Visit: Payer: Medicaid Other | Admitting: Physician Assistant

## 2019-06-06 ENCOUNTER — Other Ambulatory Visit: Payer: Self-pay

## 2019-06-06 VITALS — BP 103/64 | HR 99 | Temp 97.0°F | Wt 195.4 lb

## 2019-06-06 DIAGNOSIS — Z3400 Encounter for supervision of normal first pregnancy, unspecified trimester: Secondary | ICD-10-CM

## 2019-06-06 DIAGNOSIS — Z23 Encounter for immunization: Secondary | ICD-10-CM | POA: Diagnosis not present

## 2019-06-06 DIAGNOSIS — Z3402 Encounter for supervision of normal first pregnancy, second trimester: Secondary | ICD-10-CM

## 2019-06-06 DIAGNOSIS — O99013 Anemia complicating pregnancy, third trimester: Secondary | ICD-10-CM | POA: Insufficient documentation

## 2019-06-06 LAB — OB RESULTS CONSOLE HIV ANTIBODY (ROUTINE TESTING): HIV: NONREACTIVE

## 2019-06-06 LAB — HEMOGLOBIN, FINGERSTICK: Hemoglobin: 10.3 g/dL — ABNORMAL LOW (ref 11.1–15.9)

## 2019-06-06 MED ORDER — FERROUS SULFATE 324 (65 FE) MG PO TBEC: 1.0000 | DELAYED_RELEASE_TABLET | Freq: Every day | ORAL | 0 refills | Status: DC

## 2019-06-06 NOTE — Progress Notes (Signed)
Here today for 27.4 week MH RV. Taking PNV QD, denies ED/hospital visits since last RV. 28 week labs and Tdap today. Hal Morales, RN

## 2019-06-06 NOTE — Progress Notes (Addendum)
Flu and Tdap vaccines given and tolerated well. Hal Morales, RN Patient treated for Anemia per standing orders. Patient called and rescheduled missed [facility] Anatomy Scan for 06/26/2019 @ 3:15. Hal Morales, RN

## 2019-06-06 NOTE — Progress Notes (Signed)
   PRENATAL VISIT NOTE  Subjective:  Natalie Jenkins is a 18 y.o. G1P0 at [redacted]w[redacted]d being seen today for ongoing prenatal care.  She is currently monitored for the following issues for this low-risk pregnancy and has Depression; UTI (urinary tract infection) in pregnancy, antepartum; Rubella non-immune status, antepartum; and Supervision of low-risk first pregnancy on their problem list.  Patient reports no complaints.  Contractions: Not present. Vag. Bleeding: None.  Movement: Present. Denies leaking of fluid/ROM.   The following portions of the patient's history were reviewed and updated as appropriate: allergies, current medications, past family history, past medical history, past social history, past surgical history and problem list. Problem list updated.  Objective:   Vitals:   06/06/19 1324  BP: 103/64  Pulse: 99  Temp: (!) 97 F (36.1 C)  Weight: 195 lb 6.4 oz (88.6 kg)    Fetal Status: Fetal Heart Rate (bpm): 136 Fundal Height: 29 cm Movement: Present     General:  Alert, oriented and cooperative. Patient is in no acute distress.  Skin: Skin is warm and dry. No rash noted.   Cardiovascular: Normal heart rate noted  Respiratory: Normal respiratory effort, no problems with respiration noted  Abdomen: Soft, gravid, appropriate for gestational age.  Pain/Pressure: Absent     Pelvic: Cervical exam deferred        Extremities: Normal range of motion.  Edema: Trace  Mental Status: Normal mood and affect. Normal behavior. Normal judgment and thought content.   Assessment and Plan:  Pregnancy: G1P0 at [redacted]w[redacted]d  1. Encounter for supervision of low-risk first pregnancy in second trimester Pt states she forgot to pick up [facility] [place] letter last month, so still has not had anat [place]. Letter given today, pt to be given [facility] phone contact to sched [place]. Agrees to flu shot today. - Glucose, 1 hour gestational - RPR - HIV [place] [facility] - Tdap vaccine greater than or equal to 7yo IM -  Hemoglobin, venipuncture   Preterm labor symptoms and general obstetric precautions including but not limited to vaginal bleeding, contractions, leaking of fluid and fetal movement were reviewed in detail with the patient. Please refer to After Visit Summary for other counseling recommendations.  Return in about 2 weeks (around 06/20/2019) for Routine prenatal care.  No future appointments.  Lora Havens, PA-C

## 2019-06-07 LAB — FE+CBC/D/PLT+TIBC+FER+RETIC
Basophils Absolute: 0.1 10*3/uL (ref 0.0–0.2)
Basos: 0 %
EOS (ABSOLUTE): 0.1 10*3/uL (ref 0.0–0.4)
Eos: 1 %
Ferritin: 7 ng/mL — ABNORMAL LOW (ref 15–150)
Hematocrit: 30 % — ABNORMAL LOW (ref 34.0–46.6)
Hemoglobin: 10.1 g/dL — ABNORMAL LOW (ref 11.1–15.9)
Immature Grans (Abs): 0.2 10*3/uL — ABNORMAL HIGH (ref 0.0–0.1)
Immature Granulocytes: 1 %
Iron Saturation: 8 % — CL (ref 15–55)
Iron: 39 ug/dL (ref 27–159)
Lymphocytes Absolute: 1.9 10*3/uL (ref 0.7–3.1)
Lymphs: 13 %
MCH: 31 pg (ref 26.6–33.0)
MCHC: 33.7 g/dL (ref 31.5–35.7)
MCV: 92 fL (ref 79–97)
Monocytes Absolute: 0.8 10*3/uL (ref 0.1–0.9)
Monocytes: 5 %
Neutrophils Absolute: 12.1 10*3/uL — ABNORMAL HIGH (ref 1.4–7.0)
Neutrophils: 80 %
Platelets: 388 10*3/uL (ref 150–450)
RBC: 3.26 x10E6/uL — ABNORMAL LOW (ref 3.77–5.28)
RDW: 12 % (ref 11.7–15.4)
Retic Ct Pct: 1.9 % (ref 0.6–2.6)
Total Iron Binding Capacity: 520 ug/dL — ABNORMAL HIGH (ref 250–450)
UIBC: 481 ug/dL — ABNORMAL HIGH (ref 131–425)
WBC: 15.1 10*3/uL — ABNORMAL HIGH (ref 3.4–10.8)

## 2019-06-07 LAB — RPR: RPR Ser Ql: NONREACTIVE

## 2019-06-07 LAB — GLUCOSE, 1 HOUR GESTATIONAL: Gestational Diabetes Screen: 97 mg/dL (ref 65–139)

## 2019-06-18 ENCOUNTER — Telehealth: Payer: Self-pay

## 2019-06-18 NOTE — Telephone Encounter (Signed)
Phone call to patient to inform of need to increase Iron to BID. No answer, LMTC. Patient has [place] RV appt for 06/20/2019. If patient does not return call will inform at above scheduled appt. Interpretation assistance provided by M. [place] Hal Morales, RN

## 2019-06-20 ENCOUNTER — Encounter: Payer: Self-pay | Admitting: Family Medicine

## 2019-06-20 ENCOUNTER — Other Ambulatory Visit: Payer: Self-pay

## 2019-06-20 ENCOUNTER — Ambulatory Visit: Payer: Self-pay | Admitting: Family Medicine

## 2019-06-20 VITALS — BP 103/60 | HR 92 | Temp 96.8°F | Wt 198.8 lb

## 2019-06-20 DIAGNOSIS — Z283 Underimmunization status: Secondary | ICD-10-CM

## 2019-06-20 DIAGNOSIS — O99013 Anemia complicating pregnancy, third trimester: Secondary | ICD-10-CM

## 2019-06-20 DIAGNOSIS — O2340 Unspecified infection of urinary tract in pregnancy, unspecified trimester: Secondary | ICD-10-CM

## 2019-06-20 DIAGNOSIS — Z2839 Other underimmunization status: Secondary | ICD-10-CM

## 2019-06-20 DIAGNOSIS — Z3403 Encounter for supervision of normal first pregnancy, third trimester: Secondary | ICD-10-CM

## 2019-06-20 DIAGNOSIS — O09899 Supervision of other high risk pregnancies, unspecified trimester: Secondary | ICD-10-CM

## 2019-06-20 LAB — URINALYSIS
Bilirubin, UA: NEGATIVE
Glucose, UA: NEGATIVE
Ketones, UA: NEGATIVE
Leukocytes,UA: NEGATIVE
Nitrite, UA: NEGATIVE
Protein,UA: NEGATIVE
RBC, UA: NEGATIVE
Specific Gravity, UA: 1.02 (ref 1.005–1.030)
Urobilinogen, Ur: 0.2 mg/dL (ref 0.2–1.0)
pH, UA: 7 (ref 5.0–7.5)

## 2019-06-20 LAB — HEMOGLOBIN, FINGERSTICK: Hemoglobin: 10.9 g/dL — ABNORMAL LOW (ref 11.1–15.9)

## 2019-06-20 NOTE — Progress Notes (Signed)
  PRENATAL VISIT NOTE  Subjective:  Natalie Jenkins is a 18 y.o. G1P0 at [redacted]w[redacted]d being seen today for ongoing prenatal care.  She is currently monitored for the following issues for this low-risk pregnancy and has Depression; UTI (urinary tract infection) in pregnancy, antepartum; Rubella non-immune status, antepartum; Supervision of low-risk first pregnancy; and Anemia affecting pregnancy in third trimester on their problem list.  Patient reports backache.  Contractions: Not present. Vag. Bleeding: None.  Movement: Present. Denies leaking of fluid/ROM.   States she has also been having spots/lines in her vision, come and go x3 wks, last a minute when they come, comes ~3-4 times per week. Associated with some dizziness, denies fainting. Denies HA, swelling of hands or face. Endorses nausea, 1 wk ago vomited 2 days in a row.    The following portions of the patient's history were reviewed and updated as appropriate: allergies, current medications, past family history, past medical history, past social history, past surgical history and problem list. Problem list updated.  Objective:   Vitals:   06/20/19 1516  BP: 103/60  Pulse: 92  Temp: (!) 96.8 F (36 C)  Weight: 198 lb 12.8 oz (90.2 kg)    Fetal Status: Fetal Heart Rate (bpm): 150 Fundal Height: 30 cm Movement: Present     General:  Alert, oriented and cooperative. Patient is in no acute distress.  Skin: Skin is warm and dry. No rash noted.   Cardiovascular: Normal heart rate noted  Respiratory: Normal respiratory effort, no problems with respiration noted  Abdomen: Soft, gravid, appropriate for gestational age.  Pain/Pressure: Absent     Pelvic: Cervical exam deferred        Extremities: Normal range of motion.  Edema: Mild pitting, slight indentation  Mental Status: Normal mood and affect. Normal behavior. Normal judgment and thought content.   Assessment and Plan:  Pregnancy: G1P0 at [redacted]w[redacted]d    1. Encounter for  supervision of low-risk first pregnancy in third trimester -[facility] anatomy US appt scheduled for 06/26/2019 at 3:15 pm. -Occasional spots in vision that are associated with dizziness likely d/t normal decrease in blood pressure at this point in pregnancy. Hgb today 10.9, urine today wnl. Reassurance given, though preeclampsia s/s discussed and pt to go to ER if present or if vision changes/ dizziness worsens.   -Handout for back exercises and groin pain given. - Urinalysis (Urine Dip)  2. Anemia affecting pregnancy in third trimester Pt will begin taking iron supplement BID  3. Rubella non-immune status, antepartum Needs pp vaccination  4. UTI (urinary tract infection) in pregnancy, antepartum TOC negative    Preterm labor symptoms and general obstetric precautions including but not limited to vaginal bleeding, contractions, leaking of fluid and fetal movement were reviewed in detail with the patient. Please refer to After Visit Summary for other counseling recommendations.  Return in about 2 weeks (around 07/04/2019) for routine prenatal care.  Future Appointments  Date Time Provider [place]  07/04/2019  3:40 PM AC-MH PROVIDER AC-MAT None    Kandee Keen, PA-C

## 2019-06-20 NOTE — Progress Notes (Signed)
Denies international travel for self or FOB since pregnant. Counseled to increase iron tablet to BID per Dr. Ernestina Patches order. Client correctly verbalizes how to take iron and PNV. Counseled on results of 28 week labs (HIV result pending). Aware of [facility] [place] appt 06/26/2019 at 3:15 pm. Rich Number, RN  Hgb and urine dip reviewed by Earnestine Mealing PA-C. No new orders given. Rich Number, RN

## 2019-06-26 ENCOUNTER — Ambulatory Visit
Admit: 2019-06-26 | Discharge: 2019-06-27 | Payer: MEDICAID | Attending: Obstetrics & Gynecology | Primary: Obstetrics & Gynecology

## 2019-06-26 ENCOUNTER — Ambulatory Visit: Admit: 2019-06-26 | Discharge: 2019-06-27 | Payer: MEDICAID

## 2019-06-26 DIAGNOSIS — Z3689 Encounter for other specified antenatal screening: Principal | ICD-10-CM

## 2019-06-27 ENCOUNTER — Encounter: Payer: Self-pay | Admitting: Physician Assistant

## 2019-06-27 DIAGNOSIS — Z3403 Encounter for supervision of normal first pregnancy, third trimester: Secondary | ICD-10-CM

## 2019-06-27 DIAGNOSIS — O403XX0 Polyhydramnios, third trimester, not applicable or unspecified: Secondary | ICD-10-CM | POA: Insufficient documentation

## 2019-06-27 NOTE — Progress Notes (Signed)
Reviewed 06/26/19 [facility] [place] at 31 wk for anatomy and increased BMI. Mild polyhydramnios noted, f/u US in 3 wk sched by [facility]. Prob list updated.

## 2019-07-04 ENCOUNTER — Ambulatory Visit: Payer: Self-pay

## 2019-07-04 ENCOUNTER — Ambulatory Visit: Payer: Self-pay | Admitting: Family Medicine

## 2019-07-04 ENCOUNTER — Other Ambulatory Visit: Payer: Self-pay

## 2019-07-04 VITALS — BP 99/56 | HR 81 | Temp 97.3°F | Wt 203.6 lb

## 2019-07-04 DIAGNOSIS — O99013 Anemia complicating pregnancy, third trimester: Secondary | ICD-10-CM

## 2019-07-04 DIAGNOSIS — Z3403 Encounter for supervision of normal first pregnancy, third trimester: Secondary | ICD-10-CM

## 2019-07-04 DIAGNOSIS — O403XX0 Polyhydramnios, third trimester, not applicable or unspecified: Secondary | ICD-10-CM

## 2019-07-04 DIAGNOSIS — K219 Gastro-esophageal reflux disease without esophagitis: Secondary | ICD-10-CM

## 2019-07-04 NOTE — Progress Notes (Signed)
   PRENATAL VISIT NOTE  Subjective:  Natalie Jenkins is a 19 y.o. G1P0 at [redacted]w[redacted]d being seen today for ongoing prenatal care.  She is currently monitored for the following issues for this low-risk pregnancy and has Depression; UTI (urinary tract infection) in pregnancy, antepartum; Rubella non-immune status, antepartum; Supervision of low-risk first pregnancy; Anemia affecting pregnancy in third trimester; and Polyhydramnios affecting pregnancy in third trimester on their problem list.   Pt reports GERD: has epigastric burning, cough after eating. Has not taken any medication for this.   Contractions: Not present. Vag. Bleeding: None.  Movement: Present. Denies leaking of fluid/ROM.   The following portions of the patient's history were reviewed and updated as appropriate: allergies, current medications, past family history, past medical history, past social history, past surgical history and problem list. Problem list updated.  Objective:   Vitals:   07/04/19 1548  BP: (!) 99/56  Pulse: 81  Temp: (!) 97.3 F (36.3 C)  Weight: 203 lb 9.6 oz (92.4 kg)    Fetal Status: Fetal Heart Rate (bpm): 160 Fundal Height: 32 cm Movement: Present     General:  Alert, oriented and cooperative. Patient is in no acute distress.  Skin: Skin is warm and dry. No rash noted.   Cardiovascular: Normal heart rate noted  Respiratory: Normal respiratory effort, no problems with respiration noted  Abdomen: Soft, gravid, appropriate for gestational age.  Pain/Pressure: Absent     Pelvic: Cervical exam deferred        Extremities: Normal range of motion.  Edema: Trace  Mental Status: Normal mood and affect. Normal behavior. Normal judgment and thought content.   Assessment and Plan:  Pregnancy: G1P0 at [redacted]w[redacted]d    1. Encounter for supervision of low-risk first pregnancy in third trimester -Up to date. -Vision disturbances at last visit now resolved.   2. Polyhydramnios affecting pregnancy in third  trimester -Diagnosed on 1/6 u/s, AFI 24.3cm. No SOB or abd discomfort. 1 hr GTT at 28 wks wnl (97).  -3 wk repeat u/s scheduled for 1/27 to f/u AFI, fetal anatomy (cardiac views limited), and growth.   3. Anemia affecting pregnancy in third trimester -Continue taking iron pills BID  4. Gastroesophageal reflux disease, unspecified whether esophagitis present -Advised eating small meals and avoiding common trigger foods/drinks, elevating head at nighttime, not reclining immediately after eating, and using Tums as needed. If that fails advised trial of famotidine (Pepcid) 10mg  twice per day. Handout with information given as well.      Preterm labor symptoms and general obstetric precautions including but not limited to vaginal bleeding, contractions, leaking of fluid and fetal movement were reviewed in detail with the patient. Please refer to After Visit Summary for other counseling recommendations.  Return in about 2 weeks (around 07/18/2019) for routine prenatal care.  Future Appointments  Date Time Provider Department Center  07/18/2019  3:40 PM AC-MH PROVIDER AC-MAT None    07/20/2019, PA-C

## 2019-07-04 NOTE — Progress Notes (Signed)
Patient here for MH RV at 31 47. Given pediatricians list and [facility] pamphlet today. Patient states she  kept 06/26/2019 appointment for [facility] U/S. Kick counts reviewed today and cards given. Patient taking iron supplement twice daily.Burt Knack, RN

## 2019-07-05 ENCOUNTER — Encounter: Payer: Self-pay | Admitting: Family Medicine

## 2019-07-09 ENCOUNTER — Telehealth: Payer: Self-pay | Admitting: Family Medicine

## 2019-07-10 ENCOUNTER — Ambulatory Visit: Payer: Self-pay | Admitting: Advanced Practice Midwife

## 2019-07-10 ENCOUNTER — Other Ambulatory Visit: Payer: Self-pay

## 2019-07-10 DIAGNOSIS — O99891 Other specified diseases and conditions complicating pregnancy: Secondary | ICD-10-CM

## 2019-07-10 DIAGNOSIS — O99013 Anemia complicating pregnancy, third trimester: Secondary | ICD-10-CM

## 2019-07-10 DIAGNOSIS — Z3403 Encounter for supervision of normal first pregnancy, third trimester: Secondary | ICD-10-CM

## 2019-07-10 DIAGNOSIS — O403XX0 Polyhydramnios, third trimester, not applicable or unspecified: Secondary | ICD-10-CM

## 2019-07-10 DIAGNOSIS — Z283 Underimmunization status: Secondary | ICD-10-CM

## 2019-07-10 DIAGNOSIS — O2340 Unspecified infection of urinary tract in pregnancy, unspecified trimester: Secondary | ICD-10-CM

## 2019-07-10 DIAGNOSIS — Z2839 Other underimmunization status: Secondary | ICD-10-CM

## 2019-07-10 LAB — WET PREP FOR TRICH, YEAST, CLUE
Trichomonas Exam: NEGATIVE
Yeast Exam: NEGATIVE

## 2019-07-10 NOTE — Telephone Encounter (Signed)
Returned call via interpreter V. Olmedo; c/o x 3-4 days pain in vaginal area and labia red & swollen; good FM; also has had pain near umbilicus and some diarrhea; denies s/s Covid or known exposure; consulted E. Sciora, CNM offered a work-in appt. Or may go to [facility] for eval.; will come to [facility] this am for work-in Sharlette Dense, RN

## 2019-07-10 NOTE — Progress Notes (Signed)
In per phone call -c/o vaginal pain Sharlette Dense, RN

## 2019-07-10 NOTE — Progress Notes (Signed)
   PRENATAL VISIT NOTE  Subjective:  Natalie Jenkins is a 19 y.o. G1P0 at [redacted]w[redacted]d being seen today for ongoing prenatal care.  She is currently monitored for the following issues for this high-risk pregnancy and has Depression; UTI (urinary tract infection) in pregnancy, antepartum; Rubella non-immune status, antepartum; Supervision of low-risk first pregnancy; Anemia affecting pregnancy in third trimester; and Polyhydramnios affecting pregnancy in third trimester on their problem list.  Patient reports pain inside vagina and after finishes voiding x 4 days with no relieving factors; aggravated with walking, sitting, changing positions..  Contractions: Not present. Vag. Bleeding: None.  Movement: Present. Denies leaking of fluid/ROM. Last day of work 07/02/19 and quit on her own.  Last sex 07/03/19.  The following portions of the patient's history were reviewed and updated as appropriate: allergies, current medications, past family history, past medical history, past social history, past surgical history and problem list. Problem list updated.  Objective:   Vitals:   07/10/19 1024  BP: 111/74  Pulse: (!) 107  Temp: (!) 97.5 F (36.4 C)  Weight: 206 lb 9.6 oz (93.7 kg)    Fetal Status: Fetal Heart Rate (bpm): 140 Fundal Height: 32 cm Movement: Present     General:  Alert, oriented and cooperative. Patient is in no acute distress.  Skin: Skin is warm and dry. No rash noted.   Cardiovascular: Normal heart rate noted  Respiratory: Normal respiratory effort, no problems with respiration noted  Abdomen: Soft, gravid, appropriate for gestational age.  Pain/Pressure: Present  Soft without guarding, appropriate fundal height.  C/o pain inside vagina only and after voiding, changing positions, walking, sitting.    Pelvic: Cervical exam performed      closed, thick, soft, long  Extremities: Normal range of motion.  Edema: None  Mental Status: Normal mood and affect. Normal behavior. Normal  judgment and thought content.  External genitalia wnl without edema, erythema, varicosities, lesions.  Vagina wnl; wet mount done   Assessment and Plan:  Pregnancy: G1P0 at [redacted]w[redacted]d  1. Polyhydramnios affecting pregnancy in third trimester F/u u/s 07/17/19  2. Anemia affecting pregnancy in third trimester Taking FeSo4 BID with oj  3. Encounter for supervision of low-risk first pregnancy in third trimester C&S done today Suspect UTI/normal discomforts of pregnancy due to polyhydramnios To keep RV appt next week - WET PREP FOR TRICH, YEAST, CLUE - Urine Culture & Sensitivit     Preterm labor symptoms and general obstetric precautions including but not limited to vaginal bleeding, contractions, leaking of fluid and fetal movement were reviewed in detail with the patient. Please refer to After Visit Summary for other counseling recommendations.  No follow-ups on file.  Future Appointments  Date Time Provider Department Center  07/18/2019  3:40 PM [facility] PROVIDER [facility] None    Alberteen Spindle, CNM

## 2019-07-12 ENCOUNTER — Other Ambulatory Visit: Payer: Self-pay

## 2019-07-12 ENCOUNTER — Ambulatory Visit: Payer: Self-pay | Admitting: Family Medicine

## 2019-07-12 VITALS — BP 112/67 | HR 103 | Temp 97.9°F | Wt 210.4 lb

## 2019-07-12 DIAGNOSIS — O2340 Unspecified infection of urinary tract in pregnancy, unspecified trimester: Secondary | ICD-10-CM

## 2019-07-12 DIAGNOSIS — Z3403 Encounter for supervision of normal first pregnancy, third trimester: Secondary | ICD-10-CM

## 2019-07-12 LAB — URINE CULTURE

## 2019-07-12 MED ORDER — CEPHALEXIN 500 MG PO CAPS: 500.0000 mg | ORAL_CAPSULE | Freq: Three times a day (TID) | ORAL | 0 refills | Status: AC

## 2019-07-12 NOTE — Progress Notes (Signed)
In for UTI Tx Sharlette Dense, RN

## 2019-07-12 NOTE — Progress Notes (Signed)
S: Pt is here to pick up UTI treatment. Urine C&S resulted >100,000 cfu Streptococcus mitis. She continues to have symptoms of pain after urinating and pain in vagina for which she was seen here 2 days ago.   O:  Today's Vitals   07/12/19 1641  BP: 112/67  Pulse: (!) 103  Temp: 97.9 F (36.6 C)  Weight: 210 lb 6.4 oz (95.4 kg)   Body mass index is 35.01 kg/m. Gen: well appearing, NAD HEENT: no scleral icterus Lung: Normal WOB Ext: warm well perfused, no edema  A/P:  1. UTI (urinary tract infection) in pregnancy, antepartum -Discussed with Dr. Alvester Morin, will rx Keflex. RTC 1 wk for TOC. - cephALEXin (KEFLEX) 500 MG capsule; Take 1 capsule (500 mg total) by mouth 3 (three) times daily for 7 days.  Dispense: 21 capsule; Refill: 0  2. Encounter for supervision of low-risk first pregnancy in third trimester No other complaints per pt. See recent visit 2 days ago.

## 2019-07-13 ENCOUNTER — Encounter: Payer: Self-pay | Admitting: Family Medicine

## 2019-07-17 ENCOUNTER — Ambulatory Visit: Admit: 2019-07-17 | Discharge: 2019-07-18 | Payer: MEDICAID

## 2019-07-18 ENCOUNTER — Other Ambulatory Visit: Payer: Self-pay

## 2019-07-18 ENCOUNTER — Ambulatory Visit: Payer: Self-pay | Admitting: Physician Assistant

## 2019-07-18 ENCOUNTER — Encounter: Payer: Self-pay | Admitting: Physician Assistant

## 2019-07-18 DIAGNOSIS — O2340 Unspecified infection of urinary tract in pregnancy, unspecified trimester: Secondary | ICD-10-CM

## 2019-07-18 DIAGNOSIS — O403XX0 Polyhydramnios, third trimester, not applicable or unspecified: Secondary | ICD-10-CM

## 2019-07-18 DIAGNOSIS — O99013 Anemia complicating pregnancy, third trimester: Secondary | ICD-10-CM

## 2019-07-18 DIAGNOSIS — Z3403 Encounter for supervision of normal first pregnancy, third trimester: Secondary | ICD-10-CM

## 2019-07-18 LAB — HEMOGLOBIN, FINGERSTICK: Hemoglobin: 11 g/dL — ABNORMAL LOW (ref 11.1–15.9)

## 2019-07-18 NOTE — Progress Notes (Signed)
  PRENATAL VISIT NOTE  Subjective:  Natalie Jenkins is a 19 y.o. G1P0 at [redacted]w[redacted]d being seen today for ongoing prenatal care.  She is currently monitored for the following issues for this low-risk pregnancy and has Depression; UTI (urinary tract infection) in pregnancy, antepartum; Rubella non-immune status, antepartum; Supervision of low-risk first pregnancy; Anemia affecting pregnancy in third trimester; and Polyhydramnios affecting pregnancy in third trimester on their problem list.  Patient reports no complaints.  Contractions: Not present. Vag. Bleeding: None.  Movement: Present. Denies leaking of fluid/ROM.   The following portions of the patient's history were reviewed and updated as appropriate: allergies, current medications, past family history, past medical history, past social history, past surgical history and problem list. Problem list updated.  Objective:   Vitals:   07/18/19 1547  BP: 125/69  Pulse: 98  Temp: (!) 97.1 F (36.2 C)  Weight: 210 lb 6.4 oz (95.4 kg)    Fetal Status: Fetal Heart Rate (bpm): 172 Fundal Height: 35 cm Movement: Present     General:  Alert, oriented and cooperative. Patient is in no acute distress.  Skin: Skin is warm and dry. No rash noted.   Cardiovascular: Normal heart rate noted  Respiratory: Normal respiratory effort, no problems with respiration noted  Abdomen: Soft, gravid, appropriate for gestational age.  Pain/Pressure: Absent     Pelvic: Cervical exam deferred        Extremities: Normal range of motion.  Edema: Trace  Mental Status: Normal mood and affect. Normal behavior. Normal judgment and thought content.   Assessment and Plan:  Pregnancy: G1P0 at [redacted]w[redacted]d  1. Polyhydramnios affecting pregnancy in third trimester Reviewed 07/17/19 [facility] [place] which shows normal AFI/no polyhydramnios. Growth WNL, placenta anterior.  2. Anemia affecting pregnancy in third trimester Taking oral iron, repeat hgb today. - Hemoglobin,  venipuncture  3. Encounter for supervision of low-risk first pregnancy in third trimester Counseled re: next visit in 2 weeks with routine rectovag cultures, then weekly visits.  4. UTI (urinary tract infection) in pregnancy, antepartum Pt to complete remaining 2 days of UTI antibiotic course; plan TOC at RV.   Preterm labor symptoms and general obstetric precautions including but not limited to vaginal bleeding, contractions, leaking of fluid and fetal movement were reviewed in detail with the patient. Please refer to After Visit Summary for other counseling recommendations.  Return in about 2 weeks (around 08/01/2019) for Routine prenatal care.  No future appointments.  Landry Dyke, PA-C

## 2019-07-18 NOTE — Progress Notes (Signed)
Patient here for maternity revisit at 38 4/7. Patient states she went to [facility] for U/S yesterday. Still has 2 more days of medicine for UTI, recheck at next RV. Needs Hgb today. Still unsure of pediatrician but working on finding one.Burt Knack, RN

## 2019-07-25 ENCOUNTER — Telehealth: Payer: Self-pay | Admitting: Family Medicine

## 2019-07-25 NOTE — Telephone Encounter (Signed)
Returned call-interpreted by V. Olmedo-c/o "hip" pain since up and since noon has radiated up to mid-back; +nausea, no vomiting, no fever, and good FM; consulted A. Streilein, PA and advised may use Tylenol-extra strength-2 tabs q. 6 hr, prn, with a lot of water; apply ice to area; if no relief from pain over weekend, may need to go to [facility]-pt. Agreed to plan; also reported she completed Keflex Sharlette Dense, RN

## 2019-07-25 NOTE — Telephone Encounter (Signed)
patient states she woke up today with pain on her right side and slowly that pain has been increasing and moving into her back. Would like to know if its normal or if she should be checked out for that.

## 2019-08-01 ENCOUNTER — Other Ambulatory Visit: Payer: Self-pay

## 2019-08-01 ENCOUNTER — Ambulatory Visit: Payer: Self-pay | Admitting: Family Medicine

## 2019-08-01 DIAGNOSIS — O2340 Unspecified infection of urinary tract in pregnancy, unspecified trimester: Secondary | ICD-10-CM

## 2019-08-01 DIAGNOSIS — O09899 Supervision of other high risk pregnancies, unspecified trimester: Secondary | ICD-10-CM

## 2019-08-01 DIAGNOSIS — O99013 Anemia complicating pregnancy, third trimester: Secondary | ICD-10-CM

## 2019-08-01 DIAGNOSIS — O403XX0 Polyhydramnios, third trimester, not applicable or unspecified: Secondary | ICD-10-CM

## 2019-08-01 DIAGNOSIS — O99891 Other specified diseases and conditions complicating pregnancy: Secondary | ICD-10-CM

## 2019-08-01 DIAGNOSIS — Z283 Underimmunization status: Secondary | ICD-10-CM

## 2019-08-01 DIAGNOSIS — Z3403 Encounter for supervision of normal first pregnancy, third trimester: Secondary | ICD-10-CM

## 2019-08-01 NOTE — Progress Notes (Signed)
   PRENATAL VISIT NOTE  Subjective:  Natalie Jenkins is a 19 y.o. G1P0 at 70w4dbeing seen today for ongoing prenatal care.  She is currently monitored for the following issues for this low-risk pregnancy and has Depression; UTI (urinary tract infection) in pregnancy, antepartum; Rubella non-immune status, antepartum; Supervision of low-risk first pregnancy; Anemia affecting pregnancy in third trimester; and Polyhydramnios affecting pregnancy in third trimester on their problem list.  Patient reports hip pain- starting last week and hurt to walk and sit. called and was told to try tylenol which helped and it is less but still present.  Contractions: Irregular. Vag. Bleeding: None.  Movement: Present. Denies leaking of fluid/ROM.   The following portions of the patient's history were reviewed and updated as appropriate: allergies, current medications, past family history, past medical history, past social history, past surgical history and problem list. Problem list updated.  Objective:   Vitals:   08/01/19 1611  BP: (!) 108/58  Pulse: 80  Temp: (!) 97.2 F (36.2 C)  Weight: 216 lb (98 kg)    Fetal Status: Fetal Heart Rate (bpm): 145 Fundal Height: 37 cm Movement: Present  Presentation: Vertex  General:  Alert, oriented and cooperative. Patient is in no acute distress.  Skin: Skin is warm and dry. No rash noted.   Cardiovascular: Normal heart rate noted  Respiratory: Normal respiratory effort, no problems with respiration noted  Abdomen: Soft, gravid, appropriate for gestational age.  Pain/Pressure: Present     Pelvic: Cervical exam deferred        Extremities: Normal range of motion.  Edema: Trace  Mental Status: Normal mood and affect. Normal behavior. Normal judgment and thought content.   Assessment and Plan:  Pregnancy: G1P0 at 390w4d1. Polyhydramnios affecting pregnancy in third trimester Resolved  2. Anemia affecting pregnancy in third trimester Continue Fe  supplement, hgb improved on treatment Lab Results  Component Value Date   HGB 10.1 (L) 06/06/2019   HGB 11.3 02/12/2019     3. Encounter for supervision of low-risk first pregnancy in third trimester Up to date No red flags in hip pain complaint. Likely just related to end of pregnancy and relaxation of the hip ligaments. Recommended and gave handout about stretches. Reviewed website [employer] a resource with videos with stretches. Discussed 36 wk labs next visit.   4. Rubella non-immune status, antepartum MMR postpartum  5. UTI (urinary tract infection) in pregnancy, antepartum - Urine Culture   Preterm labor symptoms and general obstetric precautions including but not limited to vaginal bleeding, contractions, leaking of fluid and fetal movement were reviewed in detail with the patient. Please refer to After Visit Summary for other counseling recommendations.  Return in about 1 week (around 08/08/2019) for Routine prenatal care, in person, 36wks.  Future Appointments  Date Time Provider [place]2/18/2021 11:00 AM [facility] PROVIDER [facility] None    KiCaren MacadamMD

## 2019-08-01 NOTE — Progress Notes (Signed)
In for visit; denies hospital visits since last appt; taking PNV & Fe; completed antibiotic-urine TOC today; undecided on Peds Sharlette Dense, RN

## 2019-08-08 ENCOUNTER — Ambulatory Visit: Payer: Self-pay

## 2019-08-09 NOTE — Progress Notes (Signed)
Chart reviewed by Pharmacist  Suzanne Walker PharmD, Contract Pharmacist at [facility]  

## 2019-08-09 NOTE — Progress Notes (Signed)
Chart reviewed by Pharmacist  Suzanne Walker PharmD, Contract Pharmacist at [facility]  

## 2019-08-14 ENCOUNTER — Ambulatory Visit: Payer: Self-pay | Admitting: Advanced Practice Midwife

## 2019-08-14 ENCOUNTER — Other Ambulatory Visit: Payer: Self-pay

## 2019-08-14 DIAGNOSIS — Z2839 Other underimmunization status: Secondary | ICD-10-CM

## 2019-08-14 DIAGNOSIS — O99891 Other specified diseases and conditions complicating pregnancy: Secondary | ICD-10-CM

## 2019-08-14 DIAGNOSIS — O2340 Unspecified infection of urinary tract in pregnancy, unspecified trimester: Secondary | ICD-10-CM

## 2019-08-14 DIAGNOSIS — Z283 Underimmunization status: Secondary | ICD-10-CM

## 2019-08-14 DIAGNOSIS — O99013 Anemia complicating pregnancy, third trimester: Secondary | ICD-10-CM

## 2019-08-14 DIAGNOSIS — Z3403 Encounter for supervision of normal first pregnancy, third trimester: Secondary | ICD-10-CM

## 2019-08-14 DIAGNOSIS — O403XX0 Polyhydramnios, third trimester, not applicable or unspecified: Secondary | ICD-10-CM

## 2019-08-14 NOTE — Progress Notes (Signed)
Patient here for OB problem, changed regular appointment from tomorrow to today per patient insistence. Patient c/o abdominal pain in low abdomen, constant, pain level 6. States she is having contractions, but states it is false labor. Patient needs urine TOC today and is self-collecting 36 week labs.Burt Knack, RN

## 2019-08-14 NOTE — Progress Notes (Signed)
   PRENATAL VISIT NOTE  Subjective:  Natalie Jenkins is a 19 y.o. G1P0 at [redacted]w[redacted]d being seen today for ongoing prenatal care.  She is currently monitored for the following issues for this low-risk pregnancy and has Depression; UTI (urinary tract infection) in pregnancy, antepartum; Rubella non-immune status, antepartum; Supervision of low-risk first pregnancy; Anemia affecting pregnancy in third trimester; and Polyhydramnios affecting pregnancy in third trimester on their problem list.  Patient reports suprapubic pain 6/10 worsening yesterday and today. Pt walked in without appointment today with her partner.  Has scheduled appointment tomorrow and wants to keep it as well.  PHQ-9=6. Denies sxs depression except +moodiness, difficulty sleeping due to pain.  Not working.  Last sex 08/10/19 without pain/discomfort. Went to L&D 5 wks ago and sent home.  Contractions: Irregular. Vag. Bleeding: None.  Movement: Present. Denies leaking of fluid/ROM.   The following portions of the patient's history were reviewed and updated as appropriate: allergies, current medications, past family history, past medical history, past social history, past surgical history and problem list. Problem list updated.  Objective:  There were no vitals filed for this visit.  Fetal Status: Fetal Heart Rate (bpm): 130 Fundal Height: 37 cm Movement: Present  Presentation: Vertex  General:  Alert, oriented and cooperative. Patient is in no acute distress.  Skin: Skin is warm and dry. No rash noted.   Cardiovascular: Normal heart rate noted  Respiratory: Normal respiratory effort, no problems with respiration noted  Abdomen: Soft, gravid, appropriate for gestational age.  Pain/Pressure: Present     Pelvic: Cervical exam performed Dilation: Fingertip Effacement (%): 50 Station: Ballotable  Extremities: Normal range of motion.  Edema: Trace  Mental Status: Normal mood and affect. Normal behavior. Normal judgment and thought  content.   Assessment and Plan:  Pregnancy: G1P0 at [redacted]w[redacted]d  1. Polyhydramnios affecting pregnancy in third trimester Resolved as of 07/17/19  2. Anemia affecting pregnancy in third trimester Taking I FeSo4 daily with oj 3. Encounter for supervision of low-risk first pregnancy in third trimester Knows when to go to L&D Has car seat Suspect miseries of pregnancy, irregular u/c's, or UTI (asymptomatic) due to suprapubic discomfort Awaiting C&S.  Suggestions given for discomfort.  Counseled to go to L&D if sxs worsen 55 lb 3.2 oz (25 kg)  - Culture, beta strep (group b only) - Chlamydia/GC NAA, Confirmation - Urine Culture  4. Rubella non-immune status, antepartum   5. UTI (urinary tract infection) in pregnancy, antepartum Pt did not get C&S on 08/01/19 when she was here C&S done today as TOC  From 07/12/19 UTI   Preterm labor symptoms and general obstetric precautions including but not limited to vaginal bleeding, contractions, leaking of fluid and fetal movement were reviewed in detail with the patient. Please refer to After Visit Summary for other counseling recommendations.  No follow-ups on file.  Future Appointments  Date Time Provider Department Center  08/15/2019 11:00 AM [facility] PROVIDER [facility] None  08/21/2019 10:00 AM [facility] PROVIDER [facility] None    Alberteen Spindle, CNM

## 2019-08-15 ENCOUNTER — Ambulatory Visit: Payer: Self-pay

## 2019-08-16 ENCOUNTER — Ambulatory Visit: Admit: 2019-08-16 | Discharge: 2019-08-16 | Payer: MEDICAID

## 2019-08-16 LAB — URINE CULTURE

## 2019-08-17 LAB — CHLAMYDIA/GC NAA, CONFIRMATION
Chlamydia trachomatis, NAA: NEGATIVE
Neisseria gonorrhoeae, NAA: NEGATIVE

## 2019-08-18 LAB — CULTURE, BETA STREP (GROUP B ONLY): Strep Gp B Culture: NEGATIVE

## 2019-08-21 ENCOUNTER — Ambulatory Visit: Payer: Self-pay | Admitting: Advanced Practice Midwife

## 2019-08-21 ENCOUNTER — Other Ambulatory Visit: Payer: Self-pay

## 2019-08-21 DIAGNOSIS — O2340 Unspecified infection of urinary tract in pregnancy, unspecified trimester: Secondary | ICD-10-CM

## 2019-08-21 DIAGNOSIS — Z3403 Encounter for supervision of normal first pregnancy, third trimester: Secondary | ICD-10-CM

## 2019-08-21 DIAGNOSIS — O99013 Anemia complicating pregnancy, third trimester: Secondary | ICD-10-CM

## 2019-08-21 NOTE — Progress Notes (Addendum)
   PRENATAL VISIT NOTE  Subjective:  Natalie Jenkins is a 19 y.o. G1P0 at [redacted]w[redacted]d being seen today for ongoing prenatal care.  She is currently monitored for the following issues for this low-risk pregnancy and has Depression; UTI (urinary tract infection) in pregnancy, antepartum; Rubella non-immune status, antepartum; Supervision of low-risk first pregnancy; Anemia affecting pregnancy in third trimester; and Polyhydramnios affecting pregnancy in third trimester--resolved 07/17/19 on their problem list.  Patient reports miseries of pregnancy.  Contractions: Irregular. Vag. Bleeding: None.  Movement: Present. Denies leaking of fluid/ROM.   The following portions of the patient's history were reviewed and updated as appropriate: allergies, current medications, past family history, past medical history, past social history, past surgical history and problem list. Problem list updated.  Objective:   Vitals:   08/21/19 1002  BP: 108/69  Pulse: 87  Temp: (!) 96.6 F (35.9 C)  Weight: 230 lb 6.4 oz (104.5 kg)    Fetal Status: Fetal Heart Rate (bpm): 140 Fundal Height: 39 cm Movement: Present  Presentation: Vertex  General:  Alert, oriented and cooperative. Patient is in no acute distress.  Skin: Skin is warm and dry. No rash noted.   Cardiovascular: Normal heart rate noted  Respiratory: Normal respiratory effort, no problems with respiration noted  Abdomen: Soft, gravid, appropriate for gestational age.  Pain/Pressure: Absent     Pelvic: Cervical exam deferred        Extremities: Normal range of motion.  Edema: Trace  Mental Status: Normal mood and affect. Normal behavior. Normal judgment and thought content.   Assessment and Plan:  Pregnancy: G1P0 at [redacted]w[redacted]d  1. Anemia affecting pregnancy in third trimester Taking FeSo4 daily  2. Encounter for supervision of low-risk first pregnancy in third trimester Pt refuses to pick IOL date today and wants to "think about it".  Long  discussion with couple about IOL, potential risks of, eligible dates for IOL, when to call L&D, normalcy of pregnancy discomfort in last month of pregnancy, suggestions given for discomfort. Pt states she went to [facility] L&D 08/15/19 for decreased fetal movement and was told we could offer IOL sooner than 39 weeks and to take 1,000 mg Tylenol q 8 hours, which is relieving discomfort  3. UTI (urinary tract infection) in pregnancy, antepartum 08/14/19 C&S neg   Term labor symptoms and general obstetric precautions including but not limited to vaginal bleeding, contractions, leaking of fluid and fetal movement were reviewed in detail with the patient. Please refer to After Visit Summary for other counseling recommendations.  No follow-ups on file.  No future appointments.  Alberteen Spindle, CNM

## 2019-08-21 NOTE — Progress Notes (Signed)
Here today for 38.3 weeks. Taking PNV and Iron QD. Went to [facility] ED 08/15/2019 for decreased fetal movement. Tawny Hopping, RN

## 2019-08-27 ENCOUNTER — Ambulatory Visit: Admit: 2019-08-27 | Discharge: 2019-08-28 | Payer: MEDICAID

## 2019-08-29 ENCOUNTER — Other Ambulatory Visit: Payer: Self-pay

## 2019-08-29 ENCOUNTER — Ambulatory Visit: Payer: Self-pay | Admitting: Physician Assistant

## 2019-08-29 ENCOUNTER — Encounter: Payer: Self-pay | Admitting: Physician Assistant

## 2019-08-29 DIAGNOSIS — O99013 Anemia complicating pregnancy, third trimester: Secondary | ICD-10-CM

## 2019-08-29 DIAGNOSIS — Z3403 Encounter for supervision of normal first pregnancy, third trimester: Secondary | ICD-10-CM

## 2019-08-29 DIAGNOSIS — O403XX0 Polyhydramnios, third trimester, not applicable or unspecified: Secondary | ICD-10-CM

## 2019-08-29 NOTE — Progress Notes (Signed)
   PRENATAL VISIT NOTE  Subjective:  Natalie Jenkins is a 19 y.o. G1P0 at [redacted]w[redacted]d being seen today for ongoing prenatal care.  She is currently monitored for the following issues for this low-risk pregnancy and has Depression; UTI (urinary tract infection) in pregnancy, antepartum; Rubella non-immune status, antepartum; Supervision of low-risk first pregnancy; Anemia affecting pregnancy in third trimester; and Polyhydramnios affecting pregnancy in third trimester--resolved 07/17/19 on their problem list.  Patient reports she still has occ light vag bleeding like what she was evaluated for at [facility] yesterday. Occ crampy abdominal discomfort.  Contractions: Irregular. Vag. Bleeding: Small.  Movement: Present. Denies leaking of fluid/ROM.   The following portions of the patient's history were reviewed and updated as appropriate: allergies, current medications, past family history, past medical history, past social history, past surgical history and problem list. Problem list updated.  Objective:   Vitals:   08/29/19 1043  BP: 114/76  Pulse: 98  Temp: (!) 97.5 F (36.4 C)  Weight: 227 lb (103 kg)    Fetal Status: Fetal Heart Rate (bpm): 156 Fundal Height: 41 cm Movement: Present  Presentation: Vertex  General:  Alert, oriented and cooperative. Patient is in no acute distress.  Skin: Skin is warm and dry. No rash noted.   Cardiovascular: Normal heart rate noted  Respiratory: Normal respiratory effort, no problems with respiration noted  Abdomen: Soft, gravid, appropriate for gestational age.  Pain/Pressure: Present     Pelvic: Cervical exam deferred        Extremities: Normal range of motion.  Edema: Trace  Mental Status: Normal mood and affect. Normal behavior. Normal judgment and thought content.   Assessment and Plan:  Pregnancy: G1P0 at [redacted]w[redacted]d  1. Polyhydramnios affecting pregnancy in third trimester This has resolved.  2. Anemia affecting pregnancy in third trimester Continue  oral iron.  3. Encounter for supervision of low-risk first pregnancy in third trimester Suspect bleeding from term cervical changes, reasssurance given. To alert [place] or [facility] if increased bleeding or pain. Had pre-delivery COVID test yesterday as instructed. To keep IOL as sched at [facility] tomorrow.   Term labor symptoms and general obstetric precautions including but not limited to vaginal bleeding, contractions, leaking of fluid and fetal movement were reviewed in detail with the patient. Please refer to After Visit Summary for other counseling recommendations.  Return in about 6 weeks (around 10/10/2019) for to keep IOL appt as sched at hospital tomorrow, anticipate next clinic visit at 6-8 wk postpartum.  No future appointments.  Landry Dyke, PA-C

## 2019-08-29 NOTE — Progress Notes (Signed)
In for visit; ready for IOL tomorrow Sharlette Dense, RN

## 2019-08-31 ENCOUNTER — Ambulatory Visit
Admit: 2019-08-31 | Discharge: 2019-09-02 | Disposition: A | Discharge status: Home / Self Care | Payer: MEDICAID | Admitting: Family Medicine

## 2019-08-31 ENCOUNTER — Encounter
Admit: 2019-08-31 | Discharge: 2019-09-02 | Disposition: A | Discharge status: Home / Self Care | Payer: MEDICAID | Admitting: Family Medicine

## 2019-09-02 MED ORDER — IBUPROFEN 600 MG TABLET
Freq: Four times a day (QID) | ORAL | 0 refills | 0 days | PRN
Start: 2019-09-02 — End: ?

## 2019-09-02 MED ORDER — SENNOSIDES 8.6 MG TABLET
Freq: Every evening | ORAL | 0 refills | 0 days | PRN
Start: 2019-09-02 — End: 2019-10-02

## 2019-09-02 MED ORDER — DOCUSATE SODIUM 100 MG CAPSULE
ORAL_CAPSULE | Freq: Two times a day (BID) | ORAL | 0 refills | 30 days | Status: CP
Start: 2019-09-02 — End: 2019-10-02

## 2019-09-03 MED ORDER — POLYETHYLENE GLYCOL 3350 17 GRAM ORAL POWDER PACKET
PACK | Freq: Every day | ORAL | 0 refills | 30 days | Status: CP
Start: 2019-09-03 — End: 2019-10-03

## 2019-09-03 MED ORDER — FERROUS SULFATE 325 MG (65 MG IRON) TABLET
ORAL_TABLET | Freq: Every day | ORAL | 11 refills | 30 days | Status: CP
Start: 2019-09-03 — End: 2020-09-02

## 2019-09-19 ENCOUNTER — Telehealth: Payer: Self-pay | Admitting: Licensed Clinical Social Worker

## 2019-09-19 NOTE — Telephone Encounter (Signed)
-----   Message from Kathreen Cosier, [place] sent at 09/19/2019 12:38 PM EDT ----- Regarding: delivered 3/13

## 2019-09-19 NOTE — Telephone Encounter (Signed)
Used language line [phone] to conduct postpartum mood check. Patient reports that she is fine, and is now breastfeeding successfully after receiving help from [facility]. She denies any mood or anxiety symptoms. LCSW provided brief education on postpartum mood and anxiety symptoms and provided LCSW's number if she has any concerns regarding this. Patient did report that she has been trying to get a two week checkup appt. But hasnt been able to get through and when she does they transfer her to a voicemail. LCSW informed patient that she would send a message to the [facility] and also provided the patient with the appt. Line number.

## 2019-10-14 ENCOUNTER — Other Ambulatory Visit: Payer: Self-pay

## 2019-10-14 ENCOUNTER — Encounter: Payer: Self-pay | Admitting: Advanced Practice Midwife

## 2019-10-14 ENCOUNTER — Ambulatory Visit: Payer: Self-pay | Admitting: Advanced Practice Midwife

## 2019-10-14 DIAGNOSIS — Z23 Encounter for immunization: Secondary | ICD-10-CM

## 2019-10-14 LAB — HEMOGLOBIN, FINGERSTICK: Hemoglobin: 11.1 g/dL (ref 11.1–15.9)

## 2019-10-14 NOTE — Progress Notes (Signed)
[place] Partum Exam  Natalie Jenkins is a 19 y.o. G1P0 female who presents for a postpartum visit. She is 6 weeks postpartum following a spontaneous vaginal delivery.  IOL with SVD on 08/31/19 9#4 M at 39 6/7 wks.  FOB helping with baby.  PP coitus x 3 with condom--last time 10/12/19, time before was 10/09/19. Thinks she wants Nexplanon but wants to be pregnant in 1 year. I have fully reviewed the prenatal and intrapartum course. The delivery was at 42 6/7 gestational weeks.  Anesthesia: epidural. Postpartum course has been wnl. Baby's course has been wnl. Baby is feeding by breast and bottle Bleeding thinks this is her first period pp. Bowel function is normal. Bladder function is normal. Patient is sexually active. Contraception method is condoms.   Postpartum depression screening: Edinburgh Postnatal Depression Scale - 10/14/19 1124      Edinburgh Postnatal Depression Scale:  In the Past 7 Days   I have been able to laugh and see the funny side of things.  0    I have looked forward with enjoyment to things.  0    I have blamed myself unnecessarily when things went wrong.  0    I have been anxious or worried for no good reason.  0    I have felt scared or panicky for no good reason.  0    Things have been getting on top of me.  0    I have been so unhappy that I have had difficulty sleeping.  0    I have felt sad or miserable.  0    I have been so unhappy that I have been crying.  0    The thought of harming myself has occurred to me.  0    Edinburgh Postnatal Depression Scale Total  0         Last pap smear done today and was no results back yet  Review of Systems A comprehensive review of systems was negative.    Objective:  BP 103/65   Ht '5\' 5"'  (1.651 m)   Wt 203 lb 12.8 oz (92.4 kg)   LMP 10/11/2019 (Exact Date)   Breastfeeding Yes   BMI 33.91 kg/m   Gen: well appearing, NAD HEENT: no scleral icterus CV: RR Lung: Normal WOB Breast:performed-yes  Ext: warm well  perfused  GU: healed well from third degree laceration Rectal: performed -  not indicated       Assessment:    6 wk postpartum exam. Pap smear done at today's visit.   Plan:   1. Contraception: Contraception counseling: Reviewed all forms of birth control options in the tiered based approach. available including abstinence; over the counter/barrier methods; hormonal contraceptive medication including pill, patch, ring, injection,contraceptive implant; hormonal and nonhormonal IUDs; permanent sterilization options including vasectomy and the various tubal sterilization modalities. Risks, benefits, and typical effectiveness rates were reviewed.  Questions were answered.  Written information was also given to the patient to review.  Patient desires Nexplanon, this was not prescribed for patient. She will follow up 10/28/19 for surveillance.  She was told to call with any further questions, or with any concerns about this method of contraception.  Emphasized use of condoms 100% of the time for STI prevention.  Patient was offered ECP. ECP was not accepted by the patient. ECP counseling was not given - see RN documentation Recommended delay in pregnancy for 18 months   2. Infant feeding:  patient is currently feeding with breast and  bottle.  If breastmilk feeding patient was given letter for employer to provide appropriate pumping time to express breastmilk.   -Recommended patient engage with WIC/BFpeer counselors  -Counseled to sign new child up for [facility] services 3. Mood: EPDS is low risk. Reviewed resources and that mood sx in first year after pregnancy are considered related to pregnancy and to reach out for help at [facility] if needed. Discussed [facility] as link to care and availability of LCSW for counseling  4. Chronic Medical Conditions:   list chronic medical problems and follow up/management plan.   1. Postpartum care and examination Pt to schedule appt with [facility] lactation consultant for difficulty  breastfeeding and for electric breastpump First pap done today Return 10/28/19 for Nexplanon with abstinance until then and will need PT before receives Nexplanon - Hemoglobin, venipuncture - Pap IG (Image Guided) - MMR vaccine subcutaneous   Patient given handout about PCP care in the community Given MVI per family planning program guidelines and availability  Follow up in: 1 day or as needed.

## 2019-10-14 NOTE — Progress Notes (Signed)
Here today for PP exam. Delivered SVD 08/31/2019 @ [facility]. Wants to discuss Nexplanon for PP birth control. Tawny Hopping, RN

## 2019-10-14 NOTE — Progress Notes (Signed)
MMR given and tolerated well. Nexplanon consult completed. Appt scheduled for Nexplanon Insertion appt for 10/30/19 @ 10:40. Instructed to arrive at 10:20 for check In as well as to not have sex for the next two weeks. Patient verbalized understanding of same. Hal Morales, RN

## 2019-10-15 LAB — PAP IG (IMAGE GUIDED): PAP Smear Comment: 0

## 2019-10-16 ENCOUNTER — Telehealth: Payer: Self-pay

## 2019-10-18 NOTE — Telephone Encounter (Signed)
Pap card mailed.Richmond Campbell, RN

## 2019-10-25 ENCOUNTER — Ambulatory Visit: Payer: Self-pay

## 2019-10-30 ENCOUNTER — Other Ambulatory Visit: Payer: Self-pay

## 2019-10-30 ENCOUNTER — Ambulatory Visit (LOCAL_COMMUNITY_HEALTH_CENTER): Payer: Self-pay | Admitting: Advanced Practice Midwife

## 2019-10-30 ENCOUNTER — Encounter: Payer: Self-pay | Admitting: Advanced Practice Midwife

## 2019-10-30 VITALS — BP 100/58 | Wt 203.0 lb

## 2019-10-30 DIAGNOSIS — Z3042 Encounter for surveillance of injectable contraceptive: Secondary | ICD-10-CM

## 2019-10-30 DIAGNOSIS — Z3009 Encounter for other general counseling and advice on contraception: Secondary | ICD-10-CM

## 2019-10-30 DIAGNOSIS — Z30013 Encounter for initial prescription of injectable contraceptive: Secondary | ICD-10-CM

## 2019-10-30 LAB — PREGNANCY, URINE: Preg Test, Ur: NEGATIVE

## 2019-10-30 MED ORDER — MEDROXYPROGESTERONE ACETATE 150 MG/ML IM SUSP
150.0000 mg | Freq: Once | INTRAMUSCULAR | Status: AC
  Administered 2019-10-30: 150 mg via INTRAMUSCULAR

## 2019-10-30 NOTE — Progress Notes (Addendum)
Patient here for Nexplanon insertion. Had PP exam and Nexplanon consult done 10/14/2019. States that today she feels like she is having symptoms that menses is about to begin. After reviewing consents, patient requests to change BCM to Depo. Provider informed, patient sent to lab for urine PT, and Depo consent to be signed after patient returns from lab (if PT negative). PT negative. Depo given, right glute, tolerated well. Given per provider verbal orders. Patient given next Depo due card and counseled no sex, or use condoms with all sex for next 7 days. Patient and partner state understanding.Marland KitchenMarland KitchenBurt Knack, RN   Give DMPA 150 mg IM today if PT neg and q 11-13 wks x 1 year Please counsel on need for abstinance/back up condoms next 7 days

## 2020-01-16 ENCOUNTER — Ambulatory Visit (LOCAL_COMMUNITY_HEALTH_CENTER): Payer: Medicaid Other | Admitting: Physician Assistant

## 2020-01-16 ENCOUNTER — Encounter: Payer: Self-pay | Admitting: Physician Assistant

## 2020-01-16 ENCOUNTER — Other Ambulatory Visit: Payer: Self-pay

## 2020-01-16 ENCOUNTER — Ambulatory Visit: Payer: Self-pay

## 2020-01-16 VITALS — BP 114/72 | Ht 65.0 in | Wt 210.6 lb

## 2020-01-16 DIAGNOSIS — Z3009 Encounter for other general counseling and advice on contraception: Secondary | ICD-10-CM

## 2020-01-16 MED ORDER — NORGESTIM-ETH ESTRAD TRIPHASIC 0.18/0.215/0.25 MG-25 MCG PO TABS: 1.0000 | ORAL_TABLET | Freq: Every day | ORAL | 9 refills | Status: DC

## 2020-01-16 NOTE — Progress Notes (Signed)
   [facility] problem visit  [facility]  Subjective:  Natalie Jenkins is a 19 y.o. being seen today for   Chief Complaint  Patient presents with  . Contraception    19 you woman 8 mo postpartum desires switch from DMPA to OCPs. Desires regular menses. Now only occ spotting, last in June. Breast and bottlefeeding baby. Had PP exam and normal Pap 10/14/19. Does not smoke, have h/o migraine headaches, or FH of uterine/breast CA.    Does the patient have a current or past history of drug use? No    No components found for: HCV]   Health Maintenance Due  Topic Date Due  . Hepatitis C Screening  Never done  . COVID-19 Vaccine (1) Never done    ROS  The following portions of the patient's history were reviewed and updated as appropriate: allergies, current medications, past family history, past medical history, past social history, past surgical history and problem list. Problem list updated.   See flowsheet for other program required questions.  Objective:   Vitals:   01/16/20 1507  BP: 114/72  Weight: (!) 210 lb 9.6 oz (95.5 kg)  Height: 5\' 5"  (1.651 m)    Physical Exam Constitutional:      Appearance: Normal appearance. She is obese.  Neurological:     Mental Status: She is alert and oriented to person, place, and time.       Assessment and Plan:  Natalie Jenkins is a 19 y.o. female presenting to the [facility] Department for a Women's Health problem visit  1. Contraceptive management Discontinue DMPA and start Tri-Lo-Marzia 1 po qd, eRx 1 mo with 10 refills.     Return in about 3 months (around 04/17/2020) for Reassess contraceptive method.  No future appointments.  04/19/2020, PA-C

## 2020-01-16 NOTE — Progress Notes (Signed)
Here today to change birth control. Would like to change from Depo to OCP's. Had PP exam and Pap Smear 10/14/2019. Last Depo was 10/30/2019 (11.1 weeks.) Tawny Hopping, RN

## 2020-10-20 ENCOUNTER — Encounter: Payer: Self-pay | Admitting: Emergency Medicine

## 2020-10-20 ENCOUNTER — Other Ambulatory Visit: Payer: Self-pay

## 2020-10-20 DIAGNOSIS — Z3A01 Less than 8 weeks gestation of pregnancy: Secondary | ICD-10-CM | POA: Insufficient documentation

## 2020-10-20 DIAGNOSIS — O209 Hemorrhage in early pregnancy, unspecified: Secondary | ICD-10-CM | POA: Insufficient documentation

## 2020-10-20 DIAGNOSIS — O3680X0 Pregnancy with inconclusive fetal viability, not applicable or unspecified: Secondary | ICD-10-CM | POA: Diagnosis not present

## 2020-10-20 DIAGNOSIS — R109 Unspecified abdominal pain: Secondary | ICD-10-CM | POA: Insufficient documentation

## 2020-10-20 DIAGNOSIS — O26891 Other specified pregnancy related conditions, first trimester: Secondary | ICD-10-CM | POA: Insufficient documentation

## 2020-10-20 DIAGNOSIS — N939 Abnormal uterine and vaginal bleeding, unspecified: Secondary | ICD-10-CM | POA: Insufficient documentation

## 2020-10-20 NOTE — ED Triage Notes (Addendum)
Triage assessment per [facility] Spanish Interpreter Tiffany [phone]  Pt arrived via POV with reports positive preg test 1 week ago and again today. Pt states she is having vaginal bleeding that is bright red that started today, pt states she has changed a pad 3 times since 7pm states she is passing clots, but is not soaking through pads. Pt also reports abd pain.  G-2 P-1 goes to the Health Department.  Pt has appt on Thursday at Health Department.  LMP: 09/07/20

## 2020-10-21 ENCOUNTER — Emergency Department
Admission: EM | Admit: 2020-10-21 | Discharge: 2020-10-21 | Disposition: A | Discharge status: Home / Self Care | Payer: Medicaid Other | Attending: Emergency Medicine | Admitting: Emergency Medicine

## 2020-10-21 ENCOUNTER — Emergency Department: Payer: Medicaid Other

## 2020-10-21 DIAGNOSIS — N939 Abnormal uterine and vaginal bleeding, unspecified: Secondary | ICD-10-CM

## 2020-10-21 DIAGNOSIS — O4690 Antepartum hemorrhage, unspecified, unspecified trimester: Secondary | ICD-10-CM

## 2020-10-21 LAB — COMPREHENSIVE METABOLIC PANEL
ALT: 32 U/L (ref 0–44)
AST: 25 U/L (ref 15–41)
Albumin: 3.9 g/dL (ref 3.5–5.0)
Alkaline Phosphatase: 120 U/L (ref 38–126)
Anion gap: 7 (ref 5–15)
BUN: 10 mg/dL (ref 6–20)
CO2: 22 mmol/L (ref 22–32)
Calcium: 8.6 mg/dL — ABNORMAL LOW (ref 8.9–10.3)
Chloride: 109 mmol/L (ref 98–111)
Creatinine, Ser: 0.62 mg/dL (ref 0.44–1.00)
GFR, Estimated: >60 mL/min (ref >60)
Glucose, Bld: 94 mg/dL (ref 70–99)
Potassium: 4 mmol/L (ref 3.5–5.1)
Sodium: 138 mmol/L (ref 135–145)
Total Bilirubin: 0.3 mg/dL (ref 0.3–1.2)
Total Protein: 7.7 g/dL (ref 6.5–8.1)

## 2020-10-21 LAB — LIPASE, BLOOD: Lipase: 28 U/L (ref 11–51)

## 2020-10-21 LAB — URINALYSIS, COMPLETE (UACMP) WITH MICROSCOPIC
Bilirubin Urine: NEGATIVE
Glucose, UA: NEGATIVE mg/dL
Ketones, ur: NEGATIVE mg/dL
Leukocytes,Ua: NEGATIVE
Nitrite: NEGATIVE
Protein, ur: NEGATIVE mg/dL
Specific Gravity, Urine: 1.018 (ref 1.005–1.030)
pH: 7 (ref 5.0–8.0)

## 2020-10-21 LAB — WET PREP, GENITAL
Clue Cells Wet Prep HPF POC: NONE SEEN
Sperm: NONE SEEN
Trich, Wet Prep: NONE SEEN
Yeast Wet Prep HPF POC: NONE SEEN

## 2020-10-21 LAB — CBC
HCT: 36.8 % (ref 36.0–46.0)
Hemoglobin: 12.1 g/dL (ref 12.0–15.0)
MCH: 29.1 pg (ref 26.0–34.0)
MCHC: 32.9 g/dL (ref 30.0–36.0)
MCV: 88.5 fL (ref 80.0–100.0)
Platelets: 335 10*3/uL (ref 150–400)
RBC: 4.16 MIL/uL (ref 3.87–5.11)
RDW: 13.7 % (ref 11.5–15.5)
WBC: 10.7 10*3/uL — ABNORMAL HIGH (ref 4.0–10.5)
nRBC: 0 % (ref 0.0–0.2)

## 2020-10-21 LAB — CHLAMYDIA/NGC RT PCR (ARMC ONLY)
Chlamydia Tr: NOT DETECTED
N gonorrhoeae: NOT DETECTED

## 2020-10-21 LAB — HCG, QUANTITATIVE, PREGNANCY: hCG, Beta Chain, Quant, S: 131 m[IU]/mL — ABNORMAL HIGH (ref <5)

## 2020-10-21 LAB — ABO/RH: ABO/RH(D): O POS

## 2020-10-21 LAB — POC URINE PREG, ED: Preg Test, Ur: NEGATIVE

## 2020-10-21 MED ORDER — ACETAMINOPHEN 500 MG PO TABS
1000.0000 mg | ORAL_TABLET | Freq: Once | ORAL | Status: AC
  Administered 2020-10-21: 1000 mg via ORAL
  Filled 2020-10-21: qty 2

## 2020-10-21 NOTE — ED Provider Notes (Signed)
[facility] Emergency Department Provider Note  ____________________________________________   Event Date/Time   First MD Initiated Contact with Patient 10/21/20 0319     (approximate)  I have reviewed the triage vital signs and the nursing notes.   HISTORY  Chief Complaint Vaginal Bleeding (Pregnant)    HPI Natalie Jenkins is a 20 y.o. female G2P1 who presents to the ED with concerns for crampy, waxing and waning abdominal pain that started this AM and vaginal bleeding that started tonight.  Feels like her period.  Had a positive pregnancy test at home 2 weeks ago.  LMP 09/07/20.  No recent fever, V/D, dysuria or abnormal vaginal discharge.  No h/o STDs.  Does not have an OB yet.  Has appt with health department on Thursday, May 5th.        Spanish intrepretor used.    Past Medical History:  Diagnosis Date  . Anemia    As a child  . History of anemia    age 30 years  . History of kidney infection    as child    Patient Active Problem List   Diagnosis Date Noted  . Rubella non-immune status, antepartum 02/15/2019  . Depression 02/13/2019    Past Surgical History:  Procedure Laterality Date  . Cervical lipoma  2018  . superior cervical lipoma removal     2018 in [place], Did not go back repeat evaluation as came to [place].    Prior to Admission medications   Medication Sig Start Date End Date Taking? Authorizing Provider  ferrous sulfate 324 (65 Fe) MG TBEC Take 1 tablet (325 mg total) by mouth daily. Patient taking differently: Take 1 tablet by mouth 2 (two) times daily. Increased to BID 06/20/2019 06/06/19 09/14/19  Federico Flake, MD  nitrofurantoin, macrocrystal-monohydrate, (MACROBID) 100 MG capsule Take 1 capsule (100 mg total) by mouth 2 (two) times daily. Patient not taking: Reported on 03/12/2019 02/15/19   Alberteen Spindle, CNM  Norgestimate-Ethinyl Estradiol Triphasic (TRI-LO-MARZIA) 0.18/0.215/0.25 MG-25 MCG tab  Take 1 tablet by mouth daily. 01/16/20   Landry Dyke, PA-C  Prenatal Vit-Fe Fumarate-FA (PRENATAL VITAMIN) 27-0.8 MG TABS Take 1 tablet by mouth daily at 6 (six) AM. 01/11/19   Federico Flake, MD    Allergies Patient has no known allergies.  Family History  Problem Relation Age of Onset  . Heart disease Maternal Grandmother     Social History Social History   Tobacco Use  . Smoking status: Never Smoker  . Smokeless tobacco: Never Used  Vaping Use  . Vaping Use: Never used  Substance Use Topics  . Alcohol use: Not Currently    Comment: Last ETOH use 11/2018 prior to known pregnancy.  . Drug use: Never    Review of Systems Constitutional: No fever. Eyes: No visual changes. ENT: No sore throat. Cardiovascular: Denies chest pain. Respiratory: Denies shortness of breath. Gastrointestinal: No nausea, vomiting, diarrhea. Genitourinary: Negative for dysuria. Musculoskeletal: Negative for back pain. Skin: Negative for rash. Neurological: Negative for focal weakness or numbness.  ____________________________________________   PHYSICAL EXAM:  VITAL SIGNS: ED Triage Vitals  Enc Vitals Group     BP 10/20/20 2326 119/71     Pulse Rate 10/20/20 2326 90     Resp 10/20/20 2326 20     Temp 10/20/20 2326 98 F (36.7 C)     Temp Source 10/20/20 2326 Oral     SpO2 10/20/20 2326 98 %     Weight 10/20/20 2349  205 lb (93 kg)     Height 10/20/20 2349 5\' 5"  (1.651 m)     Head Circumference --      Peak Flow --      Pain Score 10/20/20 2349 6     Pain Loc --      Pain Edu? --      Excl. in GC? --    CONSTITUTIONAL: Alert and oriented and responds appropriately to questions. Well-appearing; well-nourished HEAD: Normocephalic EYES: Conjunctivae clear, pupils appear equal, EOM appear intact ENT: normal nose; moist mucous membranes NECK: Supple, normal ROM CARD: RRR; S1 and S2 appreciated; no murmurs, no clicks, no rubs, no gallops RESP: Normal chest excursion  without splinting or tachypnea; breath sounds clear and equal bilaterally; no wheezes, no rhonchi, no rales, no hypoxia or respiratory distress, speaking full sentences ABD/GI: Normal bowel sounds; non-distended; soft, non-tender, no rebound, no guarding, no peritoneal signs, no hepatosplenomegaly GU:  Normal external genitalia. No lesions, rashes noted.  Scant amount of dark red vaginal bleeding and cervical mucus in the posterior vaginal vault.  No hemorrhage.  No clots or tissue seen.  Cervix is closed.  Cervix is not friable.  Chaperone present for exam. BACK: The back appears normal EXT: Normal ROM in all joints; no deformity noted, no edema; no cyanosis SKIN: Normal color for age and race; warm; no rash on exposed skin NEURO: Moves all extremities equally PSYCH: The patient's mood and manner are appropriate.  ____________________________________________   LABS (all labs ordered are listed, but only abnormal results are displayed)  Labs Reviewed  COMPREHENSIVE METABOLIC PANEL - Abnormal; Notable for the following components:      Result Value   Calcium 8.6 (*)    All other components within normal limits  CBC - Abnormal; Notable for the following components:   WBC 10.7 (*)    All other components within normal limits  URINALYSIS, COMPLETE (UACMP) WITH MICROSCOPIC - Abnormal; Notable for the following components:   Color, Urine YELLOW (*)    APPearance CLEAR (*)    Hgb urine dipstick MODERATE (*)    Bacteria, UA RARE (*)    All other components within normal limits  HCG, QUANTITATIVE, PREGNANCY - Abnormal; Notable for the following components:   hCG, Beta Chain, Quant, S 131 (*)    All other components within normal limits  WET PREP, GENITAL  CHLAMYDIA/NGC RT PCR ([facility] ONLY)  LIPASE, BLOOD  POC URINE PREG, ED  ABO/RH   ____________________________________________  EKG   ____________________________________________  RADIOLOGY I, Rodrickus Min, personally  viewed and evaluated these images (plain radiographs) as part of my medical decision making, as well as reviewing the written report by the radiologist.  ED MD interpretation:  No IUP or ectopic seen.    Official radiology report(s): 12/20/20 OB LESS THAN 14 WEEKS WITH OB TRANSVAGINAL  Result Date: 10/21/2020 CLINICAL DATA:  Vaginal bleeding for 1 day, quantitative beta hCG 131 EXAM: OBSTETRIC <14 WK 12/21/2020 AND TRANSVAGINAL OB US TECHNIQUE: Both transabdominal and transvaginal ultrasound examinations were performed for complete evaluation of the gestation as well as the maternal uterus, adnexal regions, and pelvic cul-de-sac. Transvaginal technique was performed to assess early pregnancy. COMPARISON:  None. FINDINGS: Intrauterine gestational sac: None Yolk sac:  Not Visualized. Embryo:  Not Visualized. Cardiac Activity: Not Visualized. Maternal uterus/adnexae: Anteverted maternal uterus. Endometrial thickness of 11 mm. No focal endometrial collection to suggest early gestational sac. Ovaries are unremarkable with a left ovary visualized transabdominally only. No adnexal mass or collection.  Trace anechoic free fluid in the deep pelvis is nonspecific and often physiologic in a reproductive age female. IMPRESSION: No intrauterine pregnancy visualized. Differential considerations would include early intrauterine pregnancy too early to visualize, spontaneous abortion, or occult ectopic pregnancy. Recommend close clinical followup and serial quantitative beta HCGs and ultrasounds. Trace anechoic free fluid in the deep pelvis is nonspecific though often physiologic in a reproductive age female. Electronically Signed   By: Kreg Shropshire M.D.   On: 10/21/2020 03:22    ____________________________________________   PROCEDURES  Procedure(s) performed (including Critical Care):  Procedures   ____________________________________________   INITIAL IMPRESSION / ASSESSMENT AND PLAN / ED COURSE  As part of my medical  decision making, I reviewed the following data within the electronic MEDICAL RECORD NUMBER Nursing notes reviewed and incorporated, Interpreter needed, Labs reviewed , Old chart reviewed, Radiograph reviewed  and Notes from prior ED visits         Patient here with vaginal bleeding in pregnancy.  Last menstrual period 09/07/2020.  Should be 6 weeks and 2 days based on dates.  hCG however is only 131.  Suspect miscarriage.  Ultrasound shows no IUP, ectopic or other acute abnormality.  Did recommend close follow-up with her OB for repeat hCG and repeat ultrasound.  She has an appointment tomorrow.  She is well-appearing here with benign abdominal exam.  No hemorrhage.  Wet prep shows no sign of infection.  Urine unremarkable.  O+, does not need RhoGAM.  Discussed return precautions.  Recommended close follow-up with her OB.  Spanish interpreter used throughout visit.  At this time, I do not feel there is any life-threatening condition present. I have reviewed, interpreted and discussed all results (EKG, imaging, lab, urine as appropriate) and exam findings with patient/family. I have reviewed nursing notes and appropriate previous records.  I feel the patient is safe to be discharged home without further emergent workup and can continue workup as an outpatient as needed. Discussed usual and customary return precautions. Patient/family verbalize understanding and are comfortable with this plan.  Outpatient follow-up has been provided as needed. All questions have been answered.  ____________________________________________   FINAL CLINICAL IMPRESSION(S) / ED DIAGNOSES  Final diagnoses:  Vaginal bleeding in pregnancy     ED Discharge Orders    None      *Please note:  Natalie Jenkins was evaluated in Emergency Department on 10/21/2020 for the symptoms described in the history of present illness. She was evaluated in the context of the global COVID-19 pandemic, which necessitated consideration  that the patient might be at risk for infection with the SARS-CoV-2 virus that causes COVID-19. Institutional protocols and algorithms that pertain to the evaluation of patients at risk for COVID-19 are in a state of rapid change based on information released by regulatory bodies including the CDC and federal and state organizations. These policies and algorithms were followed during the patient's care in the ED.  Some ED evaluations and interventions may be delayed as a result of limited staffing during and the pandemic.*   Note:  This document was prepared using Dragon voice recognition software and may include unintentional dictation errors.   Amarii Amy, Layla Maw, DO 10/21/20 [id number]

## 2020-10-21 NOTE — Discharge Instructions (Addendum)
Your HCG today was 131.  This is low given your last menstrual period of March 21.  Please follow up with the health department as scheduled on May 5th for repeat HCG to see if this level is going down or if it is rising.  Your ultrasound did not visualize a pregnancy inside of the uterus today.  This could be because you are too early in your pregnancy to see anything yet or it could be because you have a pregnancy outside of the uterus called an ectopic pregnancy or this could be a miscarriage.  You will need to continue to follow-up with the health department to follow your hCG levels and for repeat ultrasound in 10 to 14 days.   You may take Tyelnol 1000 mg every 6 hours as needed for pain.  This medication is found over the counter.     Su HCG de hoy fue 131. Esto es bajo dado su ltimo perodo menstrual del 21 de [place]. Haga un seguimiento con el departamento de salud segn lo programado el 5 de mayo para repetir la HCG para ver si este nivel est bajando o subiendo.  Su ecografa no visualiz un embarazo dentro del tero hoy. Esto podra deberse a que est demasiado temprano en su embarazo para ver algo todava o podra deberse a que tiene Chartered loss adjuster fuera del tero llamado embarazo ectpico o podra ser un aborto espontneo. Deber continuar con el seguimiento con el departamento de salud para controlar sus niveles de hCG y repetir Investment banker, operational en 10 a 14 das.  Puede tomar Tyelnol 1000 mg cada 6 horas segn sea necesario para el dolor. Este medicamento se encuentra sin receta.

## 2020-10-22 ENCOUNTER — Telehealth: Payer: Self-pay

## 2020-10-22 NOTE — Telephone Encounter (Signed)
TC to patient to follow-up after she came to the health department this morning asking questions but didn't give clerk her name. Per patient, she had a home PT that was positive and is now bleeding. She went to [facility] ED where she had an U/S and beta hcg. Per provider , patient needs appointment in 1 week with provider for discussion of follow-up and will need beta hcg. (beta hcg at ED on 10/21/20 = 131). Patient counseled that U/S showed no baby or heartbeat, but the U/S couldn't rule out a tubal pregnancy. Patient counseled that if she begins to bleed more heavily than a normal period, or has abdominal pain to go back to ED, due to the risk of tubal pregnancy. Patient is scheduled to come to clinic for appointment on 10/28/20 to see provider and have beta Hcg. Patient states understanding and counseled to call if she has further questions. Interpreter V. Olmedo.Burt Knack, RN

## 2020-10-28 ENCOUNTER — Ambulatory Visit (LOCAL_COMMUNITY_HEALTH_CENTER): Payer: Medicaid Other | Admitting: Family Medicine

## 2020-10-28 ENCOUNTER — Other Ambulatory Visit: Payer: Self-pay

## 2020-10-28 ENCOUNTER — Encounter: Payer: Self-pay | Admitting: Family Medicine

## 2020-10-28 VITALS — BP 105/65 | Ht 65.0 in | Wt 216.6 lb

## 2020-10-28 DIAGNOSIS — O200 Threatened abortion: Secondary | ICD-10-CM | POA: Diagnosis not present

## 2020-10-28 DIAGNOSIS — Z3009 Encounter for other general counseling and advice on contraception: Secondary | ICD-10-CM

## 2020-10-28 NOTE — Progress Notes (Signed)
S: Patient was seen at [facility] ED on 10/21/20- for vaginal bleeding during pregnancy and was told to follow up with her ob provider.  Pt.. LMP was 09/07/20  O: on 10/21/20 @ [facility] ED- beta hcg was 131, U/S showed no baby or heart beat   A/P: 1. Threatened miscarriage -discussed with patient and spouse findings of ED,   Discussed the ultra sound and the beta hcg of 131 and where beta hcg level should be for 6-7 weeks of pregnancy.  - discussed with patient about results show loss of pregnancy.   -discussed with patient  Collection of beta hcg today for follow up of levels.  Pt reports bleeding has slowed down.  -discussed with patient about  Checking for products of conception. -pt and spouse questions answered.  They are interested in planning a pregnancy -discussed taking a PNV daily now.   -will follow up with results and next steps  - Beta hCG quant (ref lab)  M. [place] used for [place] interpretation.     Wendi Snipes, FNP

## 2020-10-28 NOTE — Progress Notes (Addendum)
Here for 10/21/2020 [facility] ED follow up (Bhcg and [place] results in Epic.)  Patient states she has been experiencing vaginal bleeding. Has not had further bleeding since ED visit. Does want to be pregnant.  Tawny Hopping, RN

## 2020-10-29 LAB — BETA HCG QUANT (REF LAB): hCG Quant: <1 m[IU]/mL

## 2020-11-04 ENCOUNTER — Other Ambulatory Visit: Payer: Self-pay

## 2021-06-05 ENCOUNTER — Encounter: Payer: Self-pay | Admitting: Emergency Medicine

## 2021-06-05 ENCOUNTER — Emergency Department
Admission: EM | Admit: 2021-06-05 | Discharge: 2021-06-05 | Disposition: A | Discharge status: Home / Self Care | Payer: Medicaid Other | Attending: Emergency Medicine | Admitting: Emergency Medicine

## 2021-06-05 ENCOUNTER — Other Ambulatory Visit: Payer: Self-pay

## 2021-06-05 ENCOUNTER — Emergency Department: Payer: Medicaid Other

## 2021-06-05 DIAGNOSIS — J069 Acute upper respiratory infection, unspecified: Secondary | ICD-10-CM

## 2021-06-05 DIAGNOSIS — Z86018 Personal history of other benign neoplasm: Secondary | ICD-10-CM | POA: Diagnosis not present

## 2021-06-05 DIAGNOSIS — B9789 Other viral agents as the cause of diseases classified elsewhere: Secondary | ICD-10-CM | POA: Insufficient documentation

## 2021-06-05 DIAGNOSIS — Z20822 Contact with and (suspected) exposure to covid-19: Secondary | ICD-10-CM | POA: Diagnosis not present

## 2021-06-05 DIAGNOSIS — R059 Cough, unspecified: Secondary | ICD-10-CM | POA: Diagnosis present

## 2021-06-05 LAB — GROUP A STREP BY PCR: Group A Strep by PCR: NOT DETECTED

## 2021-06-05 LAB — RESP PANEL BY RT-PCR (FLU A&B, COVID) ARPGX2
Influenza A by PCR: NEGATIVE
Influenza B by PCR: NEGATIVE
SARS Coronavirus 2 by RT PCR: NEGATIVE

## 2021-06-05 MED ORDER — LIDOCAINE VISCOUS HCL 2 % MT SOLN
15.0000 mL | Freq: Once | OROMUCOSAL | Status: AC
  Administered 2021-06-05: 15 mL via OROMUCOSAL
  Filled 2021-06-05: qty 15

## 2021-06-05 MED ORDER — GUAIFENESIN-CODEINE 100-10 MG/5ML PO SOLN: 5.0000 mL | ORAL | 0 refills | Status: DC | PRN

## 2021-06-05 MED ORDER — LIDOCAINE VISCOUS HCL 2 % MT SOLN: 5.0000 mL | OROMUCOSAL | 0 refills | Status: DC | PRN

## 2021-06-05 NOTE — Discharge Instructions (Signed)
Follow-up with your primary care provider if any continued problems.  A prescription for guaifenesin with codeine was sent to the pharmacy.  This medication contains a narcotic and could cause drowsiness.  Do not drive or operate machinery while taking this medication.  Do not give the cough medication to your child.  The lidocaine is for throat pain as needed.  Increase fluids.  You may take Tylenol or ibuprofen as needed for throat pain, fever or body aches.

## 2021-06-05 NOTE — ED Provider Notes (Signed)
°  Emergency Medicine Provider Triage Evaluation Note  Natalie Jenkins , a 20 y.o.female,  was evaluated in triage.  Pt complains of sore throat.  Patient states that she has been having a cough and sore throat recently.  Her baby was also recently diagnosed with bronchitis and she feels she is exhibiting similar symptoms.  Reports some chest congestion as well.  Denies fever/chills, abdominal pain, back pain, or urinary symptoms.   Review of Systems  Positive: Sore throat, cough Negative: Denies fever, chest pain, vomiting  Physical Exam   Vitals:   06/05/21 1245  BP: 107/73  Pulse: 87  Resp: 19  Temp: 98.2 F (36.8 C)  SpO2: 98%   Gen:   Awake, no distress   Resp:  Normal effort  MSK:   Moves extremities without difficulty  Other:    Medical Decision Making  Given the patient's initial medical screening exam, the following diagnostic evaluation has been ordered. The patient will be placed in the appropriate treatment space, once one is available, to complete the evaluation and treatment. I have discussed the plan of care with the patient and I have advised the patient that an ED physician or mid-level practitioner will reevaluate their condition after the test results have been received, as the results may give them additional insight into the type of treatment they may need.    Diagnostics: Respiratory panel, strep PCR, CXR.  Treatments: none immediately   Varney Daily, [place] 06/05/21 1256    Concha Se, MD 06/05/21 1335

## 2021-06-05 NOTE — ED Triage Notes (Signed)
Pt comes into the ED via POV c/o cough and sore throat.  Pt's baby was diagnosed with bronchitis, and now she is exhibiting symptoms.  Pt's voice is hoarse at this time.  Pt able to breath with even and unlabored respirations.  Denies any fevers at home.

## 2021-06-05 NOTE — ED Provider Notes (Signed)
[facility] Emergency Department Provider Note  ____________________________________________   Event Date/Time   First MD Initiated Contact with Patient 06/05/21 1416     (approximate)  I have reviewed the triage vital signs and the nursing notes.   HISTORY  Chief Complaint Cough and Sore Throat Spanish per husband in the room  HPI Natalie Jenkins is a 20 y.o. female presents to the ED with complaint of cough and sore throat.  Patient has been taking care of her son who was also sick.  She states that she began having the same symptoms as he did.  She denies any fever at home.  No nausea, vomiting or diarrhea.  No history of asthma and patient is non-smoker.  She rates her pain as a 9 out of 10.      Past Medical History:  Diagnosis Date   Anemia    As a child   History of anemia    age 26 years   History of kidney infection    as child    Patient Active Problem List   Diagnosis Date Noted   Rubella non-immune status, antepartum 02/15/2019   Depression 02/13/2019    Past Surgical History:  Procedure Laterality Date   Cervical lipoma  2018   superior cervical lipoma removal     2018 in [place], Did not go back repeat evaluation as came to [place].    Prior to Admission medications   Medication Sig Start Date End Date Taking? Authorizing Provider  lidocaine (XYLOCAINE) 2 % solution Use as directed 5 mLs in the mouth or throat every 4 (four) hours as needed for mouth pain. 06/05/21  Yes Bridget Hartshorn L, PA-C  ferrous sulfate 324 (65 Fe) MG TBEC Take 1 tablet (325 mg total) by mouth daily. Patient taking differently: Take 1 tablet by mouth 2 (two) times daily. Increased to BID 06/20/2019 06/06/19 09/14/19  Federico Flake, MD  guaiFENesin-codeine 100-10 MG/5ML syrup Take 5 mLs by mouth every 4 (four) hours as needed for cough. 06/05/21   Tommi Rumps, PA-C  nitrofurantoin, macrocrystal-monohydrate, (MACROBID) 100 MG capsule  Take 1 capsule (100 mg total) by mouth 2 (two) times daily. Patient not taking: Reported on 03/12/2019 02/15/19   Alberteen Spindle, CNM  Norgestimate-Ethinyl Estradiol Triphasic (TRI-LO-MARZIA) 0.18/0.215/0.25 MG-25 MCG tab Take 1 tablet by mouth daily. Patient not taking: Reported on 10/28/2020 01/16/20   Landry Dyke, PA-C  Prenatal Vit-Fe Fumarate-FA (PRENATAL VITAMIN) 27-0.8 MG TABS Take 1 tablet by mouth daily at 6 (six) AM. Patient not taking: Reported on 10/28/2020 01/11/19   Federico Flake, MD    Allergies Patient has no known allergies.  Family History  Problem Relation Age of Onset   Heart disease Maternal Grandmother     Social History Social History   Tobacco Use   Smoking status: Never   Smokeless tobacco: Never  Vaping Use   Vaping Use: Never used  Substance Use Topics   Alcohol use: Not Currently    Comment: Last ETOH use 11/2018 prior to known pregnancy.   Drug use: Never    Review of Systems Constitutional: No fever/chills Eyes: No visual changes. ENT: Positive sore throat. Cardiovascular: Denies chest pain. Respiratory: Denies shortness of breath.  Positive cough. Gastrointestinal: No abdominal pain.  No nausea, no vomiting.  No diarrhea.   Genitourinary: Negative for dysuria. Musculoskeletal: Negative for muscle aches. Skin: Negative for rash. Neurological: Negative for headaches, focal weakness or numbness. ____________________________________________   PHYSICAL EXAM:  VITAL SIGNS: ED Triage Vitals  Enc Vitals Group     BP 06/05/21 1245 107/73     Pulse Rate 06/05/21 1245 87     Resp 06/05/21 1245 19     Temp 06/05/21 1245 98.2 F (36.8 C)     Temp Source 06/05/21 1245 Oral     SpO2 06/05/21 1245 98 %     Weight 06/05/21 1246 216 lb 7.9 oz (98.2 kg)     Height 06/05/21 1246 5\' 5"  (1.651 m)     Head Circumference --      Peak Flow --      Pain Score 06/05/21 1246 9     Pain Loc --      Pain Edu? --      Excl. in GC? --      Constitutional: Alert and oriented. Well appearing and in no acute distress.  Patient frequently coughing in the room. Eyes: Conjunctivae are normal. PERRL. EOMI. Head: Atraumatic. Nose: Positive congestion/rhinnorhea. Mouth/Throat: Mucous membranes are moist.  Oropharynx non-erythematous.  No exudate, moderate posterior drainage noted. Neck: No stridor.  No tender lymphadenopathy noted. Cardiovascular: Normal rate, regular rhythm. Grossly normal heart sounds.  Good peripheral circulation. Respiratory: Normal respiratory effort.  No retractions. Lungs CTAB. Gastrointestinal: Soft and nontender. No distention.  Musculoskeletal: Moves upper and lower extremities without any difficulty.  Normal gait was noted. Neurologic:  Normal speech and language. No gross focal neurologic deficits are appreciated. Skin:  Skin is warm, dry and intact. No rash noted. Psychiatric: Mood and affect are normal. Speech and behavior are normal.  ____________________________________________   LABS (all labs ordered are listed, but only abnormal results are displayed)  Labs Reviewed  RESP PANEL BY RT-PCR (FLU A&B, COVID) [id number]  GROUP A STREP BY PCR   ____________________________________________   RADIOLOGY 06/07/21, personally viewed and evaluated these images (plain radiographs) as part of my medical decision making, as well as reviewing the written report by the radiologist.   Official radiology report(s): DG Chest 2 View  Result Date: 06/05/2021 CLINICAL DATA:  Occasionally productive cough with green sputum and sore throat x 4 days. Pt's baby was diagnosed with bronchitis, and now she is exhibiting symptoms. Pt's voice is hoarse at this time. EXAM: CHEST - 2 VIEW COMPARISON:  None. FINDINGS: Normal heart, mediastinum and hila. Clear lungs.  No pleural effusion or pneumothorax. Skeletal structures are within normal limits. IMPRESSION: Normal chest radiographs. Electronically Signed   By:  06/07/2021 M.D.   On: 06/05/2021 13:29    ____________________________________________   PROCEDURES  Procedure(s) performed (including Critical Care):  Procedures   ____________________________________________   INITIAL IMPRESSION / ASSESSMENT AND PLAN / ED COURSE  As part of my medical decision making, I reviewed the following data within the electronic MEDICAL RECORD NUMBER Notes from prior ED visits and [place] Controlled Substance Database  20 year old female presents to the ED with complaint of cough, congestion and sore throat.  Patient has been taking care of her child who has similar symptoms.  Physical exam she does have moderate amount of posterior drainage but no exudate or edema present.  Patient was made aware that her strep test was negative as well as her influenza and COVID.  We discussed that her chest x-ray did not show that she had any pneumonia.  She is willing to try the viscous lidocaine to see if this helps with her throat pain so that she can drink more fluids.  Also prescription for guaifenesin  with codeine was sent to the pharmacy to take as needed for cough.  She is aware that this medication contains a narcotic and should should not drive or operate machinery.  It was also mentioned that she could not give this to her child.  ____________________________________________   FINAL CLINICAL IMPRESSION(S) / ED DIAGNOSES  Final diagnoses:  Viral URI with cough     ED Discharge Orders          Ordered    guaiFENesin-codeine 100-10 MG/5ML syrup  Every 4 hours PRN,   Status:  Discontinued        06/05/21 1509    lidocaine (XYLOCAINE) 2 % solution  Every 4 hours PRN        06/05/21 1509    guaiFENesin-codeine 100-10 MG/5ML syrup  Every 4 hours PRN        06/05/21 1511             Note:  This document was prepared using Dragon voice recognition software and may include unintentional dictation errors.    Tommi Rumps, PA-C 06/05/21 1518    Gilles Chiquito, MD 06/05/21 [phone]

## 2021-07-01 ENCOUNTER — Ambulatory Visit (LOCAL_COMMUNITY_HEALTH_CENTER): Payer: Medicaid Other

## 2021-07-01 ENCOUNTER — Other Ambulatory Visit: Payer: Self-pay

## 2021-07-01 VITALS — BP 107/58 | Ht 65.0 in | Wt 216.5 lb

## 2021-07-01 DIAGNOSIS — Z3201 Encounter for pregnancy test, result positive: Secondary | ICD-10-CM

## 2021-07-01 LAB — PREGNANCY, URINE: Preg Test, Ur: POSITIVE — AB

## 2021-07-01 MED ORDER — PRENATAL VITAMIN 27-0.8 MG PO TABS: 1.0000 | ORAL_TABLET | ORAL | 0 refills | Status: AC

## 2021-07-01 NOTE — Progress Notes (Signed)
The Language Line used today.  Natalie Jenkins / [phone].  Patient desires her prenatal care at [facility].

## 2021-07-26 ENCOUNTER — Encounter: Payer: Self-pay | Admitting: Advanced Practice Midwife

## 2021-07-26 ENCOUNTER — Ambulatory Visit: Payer: Medicaid Other | Admitting: Advanced Practice Midwife

## 2021-07-26 ENCOUNTER — Other Ambulatory Visit: Payer: Self-pay

## 2021-07-26 DIAGNOSIS — Z369 Encounter for antenatal screening, unspecified: Principal | ICD-10-CM

## 2021-07-26 DIAGNOSIS — Z3491 Encounter for supervision of normal pregnancy, unspecified, first trimester: Principal | ICD-10-CM

## 2021-07-26 DIAGNOSIS — O0991 Supervision of high risk pregnancy, unspecified, first trimester: Secondary | ICD-10-CM | POA: Insufficient documentation

## 2021-07-26 DIAGNOSIS — Z3689 Encounter for other specified antenatal screening: Secondary | ICD-10-CM | POA: Insufficient documentation

## 2021-07-26 DIAGNOSIS — O34219 Maternal care for unspecified type scar from previous cesarean delivery: Secondary | ICD-10-CM | POA: Insufficient documentation

## 2021-07-26 DIAGNOSIS — O99211 Obesity complicating pregnancy, first trimester: Secondary | ICD-10-CM

## 2021-07-26 DIAGNOSIS — F32A Depression, unspecified: Secondary | ICD-10-CM

## 2021-07-26 DIAGNOSIS — O99210 Obesity complicating pregnancy, unspecified trimester: Secondary | ICD-10-CM | POA: Insufficient documentation

## 2021-07-26 LAB — WET PREP FOR TRICH, YEAST, CLUE
Trichomonas Exam: NEGATIVE
Yeast Exam: NEGATIVE

## 2021-07-26 LAB — URINALYSIS
Bilirubin, UA: NEGATIVE
Glucose, UA: NEGATIVE
Ketones, UA: NEGATIVE
Leukocytes,UA: NEGATIVE
Nitrite, UA: NEGATIVE
Protein,UA: NEGATIVE
Specific Gravity, UA: 1.03 (ref 1.005–1.030)
Urobilinogen, Ur: 0.2 mg/dL (ref 0.2–1.0)
pH, UA: 5.5 (ref 5.0–7.5)

## 2021-07-26 NOTE — Progress Notes (Signed)
[place] Department  [facility]   INITIAL PRENATAL VISIT NOTE  Subjective:  Natalie Jenkins is a 21 y.o. SHF G3P1011(1 year 8 mo son) at [redacted]w[redacted]d being seen today to start prenatal care at the [facility]. She feels "tired" about surprise pregnancy with condoms for birth control. 23 yo FOB feels "relaxed" about pregnancy; they share her 54 yo son and have been in a 3 year supportive relationship with employed FOB. She is unemployed and living with FOB and their son. LMP 05/03/21.  Denies u/s this pregnancy. Went to ER 05/2021 and had chest x-ray, flu, covid, strep tests. Last vomited last night and daily. Last dental exam 4 years ago. Denies cigs, vaping, cigars, MJ. Last ETOH 08/2020 (2 beers).  Last pap 10/14/19 neg.  She is currently monitored for the following issues for this high-risk pregnancy and has Depression; Rubella non-immune status, antepartum; Obesity affecting pregnancy BMI=36.6; history macrosomic infant 9#5; and Supervision of high risk pregnancy in first trimester on their problem list.  Patient reports no complaints.  Contractions: Not present.  .  Movement: Absent. Denies leaking of fluid.   Indications for ASA therapy (per uptodate) One of the following: Previous pregnancy with preeclampsia, especially early onset and with an adverse outcome No Multifetal gestation No Chronic hypertension No Type 1 or 2 diabetes mellitus No Chronic kidney disease No Autoimmune disease (antiphospholipid syndrome, systemic lupus erythematosus) No  Two or more of the following: Nulliparity No Obesity (body mass index >30 kg/m2) Yes Family history of preeclampsia in mother or sister No Age ?35 years No Sociodemographic characteristics (African American race, low socioeconomic level) No Personal risk factors (eg, previous pregnancy with low birth weight or small for gestational age infant, previous adverse pregnancy outcome [eg, stillbirth],  interval >10 years between pregnancies) No   The following portions of the patient's history were reviewed and updated as appropriate: allergies, current medications, past family history, past medical history, past social history, past surgical history and problem list. Problem list updated.  Objective:   Vitals:   07/26/21 0922  BP: 104/65  Pulse: 77  Temp: (!) 97.1 F (36.2 C)  Weight: 208 lb 12.8 oz (94.7 kg)    Fetal Status:     Movement: Absent  Presentation: Undeterminable   Physical Exam Vitals and nursing note reviewed.  Constitutional:      General: She is not in acute distress.    Appearance: Normal appearance. She is well-developed. She is obese.  HENT:     Head: Normocephalic and atraumatic.     Right Ear: External ear normal.     Left Ear: External ear normal.     Nose: Nose normal. No congestion or rhinorrhea.     Mouth/Throat:     Lips: Pink.     Mouth: Mucous membranes are moist.     Dentition: Normal dentition. No dental caries.     Pharynx: Oropharynx is clear. Uvula midline.     Comments: Dentition: fair; last dental exam 4 years ago; encouraged exam asap Eyes:     General: No scleral icterus.    Conjunctiva/sclera: Conjunctivae normal.  Neck:     Thyroid: No thyroid mass, thyromegaly or thyroid tenderness.  Cardiovascular:     Rate and Rhythm: Normal rate.     Pulses: Normal pulses.     Comments: Extremities are warm and well perfused Pulmonary:     Effort: Pulmonary effort is normal.     Breath sounds: Normal breath sounds.  Chest:     Chest wall: No mass.  Breasts:    Tanner Score is 5.     Breasts are symmetrical.     Right: Normal. No mass, nipple discharge or skin change.     Left: Normal. No mass, nipple discharge or skin change.  Abdominal:     Palpations: Abdomen is soft.     Tenderness: There is no abdominal tenderness.     Comments: Gravid, poor tone, soft without masses or tenderness, increased adipose; FHR=160  Genitourinary:     General: Normal vulva.     Exam position: Lithotomy position.     Pubic Area: No rash.      Labia:        Right: No rash.        Left: No rash.      Vagina: Vaginal discharge (white creamy leukorrhea, ph<4.5) present.     Cervix: Normal.     Uterus: Enlarged (Gravid 11-12 wks size but difficult to assess due to increased adipose). Not tender.      Rectum: Normal. No external hemorrhoid.  Musculoskeletal:     Right lower leg: No edema.     Left lower leg: No edema.  Lymphadenopathy:     Cervical: No cervical adenopathy.     Upper Body:     Right upper body: No axillary adenopathy.     Left upper body: No axillary adenopathy.  Skin:    General: Skin is warm.     Capillary Refill: Capillary refill takes less than 2 seconds.  Neurological:     Mental Status: She is alert.    Assessment and Plan:  Pregnancy: G3P1011 at [redacted]w[redacted]d  1. Obesity affecting pregnancy, antepartum Agrees to take ASA 81 mg daily to start today Counseled on weight gain of 11-20 lbs  2. history macrosomic infant 9#11   3. Supervision of high risk pregnancy in first trimester 1 hour glucola today Desires FIRST screen Dating u/s ordered Please give pt dental list and encourage exam asap  - Chlamydia/GC NAA, Confirmation - Comprehensive metabolic panel - Prenatal profile without Varicella or Rubella - HIV-1/HIV-2 Qualitative RNA - HCV Ab w Reflex to Quant PCR - Glucose tolerance, 1 hour - Protein / creatinine ratio, urine - Hgb A1c w/o eAG - Urine Culture - TSH [id number] Drug Screen - WET PREP FOR TRICH, YEAST, CLUE - Urinalysis (Urine Dip) - Hemoglobin, venipuncture    Discussed overview of care and coordination with inpatient delivery practices including [facility], [facility], [facility] and [facility].   Reviewed Centering pregnancy as standard of care at [facility]   Preterm labor symptoms and general obstetric precautions including but not limited to vaginal bleeding, contractions, leaking of  fluid and fetal movement were reviewed in detail with the patient.  Please refer to After Visit Summary for other counseling recommendations.   Return in about 4 weeks (around 08/23/2021) for routine PNC.  No future appointments.  Herbie Saxon, CNM

## 2021-07-26 NOTE — Progress Notes (Signed)
Patient here for new OB at about 12 weeks. Lives with her partner and their son, born 08/31/2018. Patient states her mother had a baby that was born alive and died a few hours later, states the baby was "premature and born with cardiac disease". Needs high BMI labs today. Desires flu vaccine today. Normal Hgb and quant gold negative on 02/12/2019. Last Pap was 10/14/2019, Negative.Burt Knack, RN

## 2021-07-26 NOTE — Progress Notes (Signed)
In house labs reviewed, no treatment indicated. Patient states she is feeling a little "woozy" after lab blood draw, and would like to delay her flu vaccine until next appointment. Dental list given. [facility] Contact card given.Marland KitchenMarland KitchenBurt Knack, RN

## 2021-07-27 LAB — CBC/D/PLT+RPR+RH+ABO+AB SCR
Antibody Screen: NEGATIVE
Basophils Absolute: 0.1 10*3/uL (ref 0.0–0.2)
Basos: 1 %
EOS (ABSOLUTE): 0.1 10*3/uL (ref 0.0–0.4)
Eos: 1 %
Hematocrit: 36.8 % (ref 34.0–46.6)
Hemoglobin: 11.9 g/dL (ref 11.1–15.9)
Hepatitis B Surface Ag: NEGATIVE
Immature Grans (Abs): 0 10*3/uL (ref 0.0–0.1)
Immature Granulocytes: 0 %
Lymphocytes Absolute: 2.9 10*3/uL (ref 0.7–3.1)
Lymphs: 23 %
MCH: 29.2 pg (ref 26.6–33.0)
MCHC: 32.3 g/dL (ref 31.5–35.7)
MCV: 90 fL (ref 79–97)
Monocytes Absolute: 0.6 10*3/uL (ref 0.1–0.9)
Monocytes: 5 %
Neutrophils Absolute: 8.5 10*3/uL — ABNORMAL HIGH (ref 1.4–7.0)
Neutrophils: 70 %
Platelets: 345 10*3/uL (ref 150–450)
RBC: 4.07 x10E6/uL (ref 3.77–5.28)
RDW: 13.8 % (ref 11.7–15.4)
RPR Ser Ql: NONREACTIVE
Rh Factor: POSITIVE
WBC: 12.2 10*3/uL — ABNORMAL HIGH (ref 3.4–10.8)

## 2021-07-27 LAB — COMPREHENSIVE METABOLIC PANEL
ALT: 12 IU/L (ref 0–32)
AST: 13 IU/L (ref 0–40)
Albumin/Globulin Ratio: 1.4 (ref 1.2–2.2)
Albumin: 4.3 g/dL (ref 3.9–5.0)
Alkaline Phosphatase: 129 IU/L — ABNORMAL HIGH (ref 44–121)
BUN/Creatinine Ratio: 9 (ref 9–23)
BUN: 5 mg/dL — ABNORMAL LOW (ref 6–20)
Bilirubin Total: <0.2 mg/dL (ref 0.0–1.2)
CO2: 20 mmol/L (ref 20–29)
Calcium: 9.5 mg/dL (ref 8.7–10.2)
Chloride: 102 mmol/L (ref 96–106)
Creatinine, Ser: 0.55 mg/dL — ABNORMAL LOW (ref 0.57–1.00)
Globulin, Total: 3 g/dL (ref 1.5–4.5)
Glucose: 68 mg/dL — ABNORMAL LOW (ref 70–99)
Potassium: 3.9 mmol/L (ref 3.5–5.2)
Sodium: 137 mmol/L (ref 134–144)
Total Protein: 7.3 g/dL (ref 6.0–8.5)
eGFR: 133 mL/min/{1.73_m2} (ref >59)

## 2021-07-27 LAB — 789231 7+OXYCODONE-BUND
Amphetamines, Urine: NEGATIVE ng/mL
BENZODIAZ UR QL: NEGATIVE ng/mL
Barbiturate screen, urine: NEGATIVE ng/mL
Cannabinoid Quant, Ur: NEGATIVE ng/mL
Cocaine (Metab.): NEGATIVE ng/mL
OPIATE SCREEN URINE: NEGATIVE ng/mL
Oxycodone/Oxymorphone, Urine: NEGATIVE ng/mL
PCP Quant, Ur: NEGATIVE ng/mL

## 2021-07-27 LAB — GLUCOSE, 1 HOUR GESTATIONAL: Gestational Diabetes Screen: 56 mg/dL — ABNORMAL LOW (ref 70–139)

## 2021-07-27 LAB — PROTEIN / CREATININE RATIO, URINE
Creatinine, Urine: 196.9 mg/dL
Protein, Ur: 15.2 mg/dL
Protein/Creat Ratio: 77 mg/g creat (ref 0–200)

## 2021-07-27 LAB — HIV-1/HIV-2 QUALITATIVE RNA
HIV-1 RNA, Qualitative: NONREACTIVE
HIV-2 RNA, Qualitative: NONREACTIVE

## 2021-07-27 LAB — HCV AB W REFLEX TO QUANT PCR: HCV Ab: <0.1 s/co ratio (ref 0.0–0.9)

## 2021-07-27 LAB — HGB A1C W/O EAG: Hgb A1c MFr Bld: 5.3 % (ref 4.8–5.6)

## 2021-07-27 LAB — TSH: TSH: 4.72 u[IU]/mL — ABNORMAL HIGH (ref 0.450–4.500)

## 2021-07-27 LAB — HCV INTERPRETATION

## 2021-07-28 ENCOUNTER — Telehealth: Payer: Self-pay | Admitting: Family Medicine

## 2021-07-28 LAB — CHLAMYDIA/GC NAA, CONFIRMATION
Chlamydia trachomatis, NAA: NEGATIVE
Neisseria gonorrhoeae, NAA: NEGATIVE

## 2021-07-28 LAB — URINE CULTURE

## 2021-07-28 NOTE — Telephone Encounter (Signed)
Call to client and counseled that provider has not yet had an opportunity to review her labs. Client aware that RN will notify her via phone call if anything needs to be done following review of labs by provider. Jossie Ng, RN

## 2021-07-28 NOTE — Telephone Encounter (Signed)
Patient doesn't understand her lab results from her visit on 02/06.

## 2021-07-29 ENCOUNTER — Encounter: Payer: Self-pay | Admitting: Advanced Practice Midwife

## 2021-07-29 DIAGNOSIS — R899 Unspecified abnormal finding in specimens from other organs, systems and tissues: Secondary | ICD-10-CM | POA: Insufficient documentation

## 2021-07-30 ENCOUNTER — Telehealth: Payer: Self-pay

## 2021-07-30 ENCOUNTER — Telehealth: Payer: Self-pay | Admitting: Family Medicine

## 2021-07-30 NOTE — Telephone Encounter (Signed)
Call to client with [facility] [employer] ID # [phone] and left message that returning her call and to please call back at her earliest convenience. Number to call provided. Jossie Ng, RN

## 2021-07-30 NOTE — Telephone Encounter (Signed)
Patient returned nurse call.   Patient states that in the oast 3 days she has been having "high blood pressure", heaviness in the legs bilateral, headache intermittently (not taking medication such a Tylenol to help), heaviness in arms bilateral, and vomiting. Vomiting x2 in the past 3 days when she feels like she is having high blood pressure. Patient also states she is having blurry vision that does not go away past 3 days.  RN asked patient what her blood pressure was at home and she states she does not have BP machine at home but when she is having these symptoms (stated above) then that is when she knows her blood pressure is high.   Encouraged patient to go to [facility] for evaluation to possible rule out pre-eclampsia.   Floy Sabina, RN

## 2021-07-30 NOTE — Telephone Encounter (Signed)
Call to [employer] and free T4 and T3 added to labs per E. Sciora CNM. Rich Number, RN

## 2021-07-30 NOTE — Telephone Encounter (Signed)
Pt would like to speak to a nurse. She says has been having high blood pressure, headache, pain in her eyes, hands, legs, and feet.

## 2021-07-31 LAB — T3 UPTAKE: T3 Uptake Ratio: 18 % — ABNORMAL LOW (ref 24–39)

## 2021-07-31 LAB — T4, FREE: Free T4: 1.23 ng/dL (ref 0.82–1.77)

## 2021-07-31 LAB — SPECIMEN STATUS REPORT

## 2021-08-02 ENCOUNTER — Encounter: Payer: Self-pay | Admitting: Advanced Practice Midwife

## 2021-08-02 DIAGNOSIS — E038 Other specified hypothyroidism: Secondary | ICD-10-CM

## 2021-08-02 HISTORY — DX: Other specified hypothyroidism: E03.8

## 2021-08-03 ENCOUNTER — Ambulatory Visit: Admit: 2021-08-03 | Discharge: 2021-08-04 | Payer: PRIVATE HEALTH INSURANCE

## 2021-08-03 ENCOUNTER — Institutional Professional Consult (permissible substitution): Admit: 2021-08-03 | Discharge: 2021-08-04 | Payer: PRIVATE HEALTH INSURANCE | Attending: MS" | Primary: MS"

## 2021-08-03 ENCOUNTER — Institutional Professional Consult (permissible substitution): Admit: 2021-08-03 | Discharge: 2021-08-04 | Payer: PRIVATE HEALTH INSURANCE

## 2021-08-03 DIAGNOSIS — Z315 Encounter for genetic counseling: Principal | ICD-10-CM

## 2021-08-03 DIAGNOSIS — Z3480 Encounter for supervision of other normal pregnancy, unspecified trimester: Principal | ICD-10-CM

## 2021-08-03 DIAGNOSIS — Z369 Encounter for antenatal screening, unspecified: Principal | ICD-10-CM

## 2021-08-12 ENCOUNTER — Encounter: Payer: Self-pay | Admitting: Physician Assistant

## 2021-08-12 ENCOUNTER — Telehealth: Payer: Self-pay | Admitting: Family Medicine

## 2021-08-12 DIAGNOSIS — O0991 Supervision of high risk pregnancy, unspecified, first trimester: Secondary | ICD-10-CM

## 2021-08-12 NOTE — Telephone Encounter (Signed)
[employer] ID # [id number] assisted with call. Jossie Ng, RN

## 2021-08-12 NOTE — Progress Notes (Signed)
Reviewed 08/03/21 [facility] US showing IUP at [redacted]w[redacted]d, ant plac. Yolk sac not visualized, gest sac visualized. EDC assigned per LMP (05/03/21) of 02/07/22.

## 2021-08-12 NOTE — Telephone Encounter (Signed)
Per client, has had similar episodes of what is described below earlier in pregnancy, but attributed to indigestion. However, episodes more frequent this week (4) and "stronger". Client reports spontaneous onset of right lower chest pain (not in breast) that radiates to ribs and back. Also becomes very sweaty when pain occurs. Pain is so strong that it makes her cry. During pain episode, she will vomit which eases the pain. States the pain makes it hard for her to catch her breath. Denies fever. Consult with ARobbie Lis PA-C who recommends ED evaluation now. Client counseled regarding recommendation and in agreement with plan. Jossie Ng, RN

## 2021-08-23 ENCOUNTER — Ambulatory Visit: Payer: Medicaid Other | Admitting: Advanced Practice Midwife

## 2021-08-23 ENCOUNTER — Other Ambulatory Visit: Payer: Self-pay

## 2021-08-23 VITALS — BP 109/67 | HR 79 | Temp 96.6°F | Wt 209.8 lb

## 2021-08-23 DIAGNOSIS — O99210 Obesity complicating pregnancy, unspecified trimester: Secondary | ICD-10-CM

## 2021-08-23 DIAGNOSIS — O0992 Supervision of high risk pregnancy, unspecified, second trimester: Secondary | ICD-10-CM

## 2021-08-23 DIAGNOSIS — Z23 Encounter for immunization: Secondary | ICD-10-CM | POA: Diagnosis not present

## 2021-08-23 DIAGNOSIS — R899 Unspecified abnormal finding in specimens from other organs, systems and tissues: Secondary | ICD-10-CM

## 2021-08-23 DIAGNOSIS — O99212 Obesity complicating pregnancy, second trimester: Secondary | ICD-10-CM

## 2021-08-23 DIAGNOSIS — O0991 Supervision of high risk pregnancy, unspecified, first trimester: Secondary | ICD-10-CM

## 2021-08-23 DIAGNOSIS — E038 Other specified hypothyroidism: Secondary | ICD-10-CM

## 2021-08-23 LAB — HEMOGLOBIN, FINGERSTICK: Hemoglobin: 12.2 g/dL (ref 11.1–15.9)

## 2021-08-23 NOTE — Progress Notes (Signed)
Flu vaccine given today, tolerated well, VIS given..Chane Cowden Brewer-Jensen, RN  

## 2021-08-23 NOTE — Progress Notes (Signed)
Patient here for [facility] RV at 16 weeks. Desires flu vaccine and AFP only today. States she is having symptoms in the evenings over the past 2 weeks: dizzy, black spots, nausea and light sensitivity. Also c/o pain in her back around her side, then gets pale and throws up, about 3 times each week. Per provider notes, patient needs TSH, Free T4 and T3 today.Burt Knack, RN  ?

## 2021-08-23 NOTE — Progress Notes (Signed)
[facility] Department ?Maternal Health Clinic ? ?PRENATAL VISIT NOTE ? ?Subjective:  ?Natalie Jenkins is a 21 y.o. G3P1011 at [redacted]w[redacted]d being seen today for ongoing prenatal care.  She is currently monitored for the following issues for this high-risk pregnancy and has Hx depression; Obesity affecting pregnancy BMI=36.6; history macrosomic infant 9#5; Supervision of high risk pregnancy in first trimester; Abnormal laboratory test TSH elevated and free T4 wnl, T3 low on 07/26/21; and  subclinical hypothyroidism 07/26/21 on their problem list. ? ?Patient reports  numerous c/o--see below .  Contractions: Not present. Vag. Bleeding: None.  Movement: Absent. Denies leaking of fluid/ROM.  ? ?The following portions of the patient's history were reviewed and updated as appropriate: allergies, current medications, past family history, past medical history, past social history, past surgical history and problem list. Problem list updated. ? ?Objective:  ? ?Vitals:  ? 08/23/21 1047  ?BP: 109/67  ?Pulse: 79  ?Temp: (!) 96.6 ?F (35.9 ?C)  ?Weight: 209 lb 12.8 oz (95.2 kg)  ? ? ?Fetal Status: Fetal Heart Rate (bpm): 150 Fundal Height: 19 cm Movement: Absent    ? ?General:  Alert, oriented and cooperative. Patient is in no acute distress.  ?Skin: Skin is warm and dry. No rash noted.   ?Cardiovascular: Normal heart rate noted  ?Respiratory: Normal respiratory effort, no problems with respiration noted  ?Abdomen: Soft, gravid, appropriate for gestational age.  Pain/Pressure: Absent     ?Pelvic: Cervical exam deferred        ?Extremities: Normal range of motion.  Edema: None  ?Mental Status: Normal mood and affect. Normal behavior. Normal judgment and thought content.  ? ?Assessment and Plan:  ?Pregnancy: G3P1011 at [redacted]w[redacted]d ? ?1. Supervision of high risk pregnancy in first trimester ?AFP only today ?1 hour glucola=56 on 08/05/21 ?Not working ?Reviewed 08/03/21 u/s at 12 6/7 with anterior placenta and unable to visualize  anatomy; pt states they said they think it's a boy ?C/o dizziness, scotoma, nausea, light sensitivity 2x/day mostly in evenings past 3 wks x 1 hour regardless of her position. Last night happened at 10 pm and 5 pm. Had dinner at 7 pm(beef, rice, guacamole, roasted onions, fruit water). ; counseled to eat small frequent high protein snacks q 2-3 hours (glucola was 56) ?Also c/o pain on right side that radiates under right breast with nausea, pale, vomits onset 3 wks ago and 3x/wk; counseled may be gallbladder and to not eat greasy, fatty foods and go to ER if worsens ? ?- AFP, Serum, Open Spina Bifida ?- Hemoglobin, fingerstick ?- CBC ? ?2.  subclinical hypothyroidism 07/26/21 ?Repeat TSH, T3, free T4 today ?- TSH + free T4 ?- T3 uptake ? ?3. Abnormal laboratory test TSH elevated and free T4 wnl, T3 low on 07/26/21 ?Repeat today ? ?4. Obesity affecting pregnancy, antepartum ?-10 lb 3.2 oz (-4.627 kg) ?Taking ASA 81 mg daily ?Walking 4x/wk x 20 min ? ? ?Preterm labor symptoms and general obstetric precautions including but not limited to vaginal bleeding, contractions, leaking of fluid and fetal movement were reviewed in detail with the patient. ?Please refer to After Visit Summary for other counseling recommendations.  ?No follow-ups on file. ? ?No future appointments. ? ?Alberteen Spindle, CNM ? ?

## 2021-08-24 LAB — CBC
Hematocrit: 34.1 % (ref 34.0–46.6)
Hemoglobin: 12.1 g/dL (ref 11.1–15.9)
MCH: 32.2 pg (ref 26.6–33.0)
MCHC: 35.5 g/dL (ref 31.5–35.7)
MCV: 91 fL (ref 79–97)
Platelets: 320 10*3/uL (ref 150–450)
RBC: 3.76 x10E6/uL — ABNORMAL LOW (ref 3.77–5.28)
RDW: 13.7 % (ref 11.7–15.4)
WBC: 11 10*3/uL — ABNORMAL HIGH (ref 3.4–10.8)

## 2021-08-24 LAB — T3 UPTAKE: T3 Uptake Ratio: 13 % — ABNORMAL LOW (ref 24–39)

## 2021-08-24 LAB — TSH+FREE T4
Free T4: 0.99 ng/dL (ref 0.82–1.77)
TSH: 2.34 u[IU]/mL (ref 0.450–4.500)

## 2021-08-25 LAB — AFP, SERUM, OPEN SPINA BIFIDA
AFP MoM: 1.81
AFP Value: 50.8 ng/mL
Gest. Age on Collection Date: 16 weeks
Maternal Age At EDD: 23.3 yr
OSBR Risk 1 IN: 1248
Test Results:: NEGATIVE
Weight: 210 [lb_av]

## 2021-09-21 ENCOUNTER — Telehealth: Payer: Self-pay

## 2021-09-21 ENCOUNTER — Ambulatory Visit: Payer: Medicaid Other

## 2021-09-21 NOTE — Telephone Encounter (Signed)
Per Rowland Lathe, client returned call and missed [facility] appt rescheduled for 09/28/2021. Rich Number, RN ? ?

## 2021-09-21 NOTE — Telephone Encounter (Signed)
[facility] for [facility] RV 09/21/21. Call to client at number ending in [phone] (on printed snapshot page in outguide) and left message to call and reschedule appt. Number to call provided. Call then made to number ending in [phone] (Epic demographic page) and per recorded message, voicemail is not set up. Salli Real interpreted during call. Note added to pink sticky to verify cell phone numbers. Jossie Ng, RN ? ?

## 2021-09-27 ENCOUNTER — Ambulatory Visit: Admit: 2021-09-27 | Discharge: 2021-09-28 | Payer: PRIVATE HEALTH INSURANCE

## 2021-09-27 MED ORDER — LIDOCAINE 4 % TOPICAL PATCH
MEDICATED_PATCH | Freq: Every day | TRANSDERMAL | 0 refills | 30 days | Status: CP | PRN
Start: 2021-09-27 — End: ?

## 2021-09-28 ENCOUNTER — Ambulatory Visit: Payer: Medicaid Other | Admitting: Advanced Practice Midwife

## 2021-09-28 VITALS — BP 111/69 | HR 77 | Wt 214.6 lb

## 2021-09-28 DIAGNOSIS — Z3689 Encounter for other specified antenatal screening: Principal | ICD-10-CM

## 2021-09-28 DIAGNOSIS — O0991 Supervision of high risk pregnancy, unspecified, first trimester: Secondary | ICD-10-CM

## 2021-09-28 DIAGNOSIS — R899 Unspecified abnormal finding in specimens from other organs, systems and tissues: Secondary | ICD-10-CM

## 2021-09-28 DIAGNOSIS — O99212 Obesity complicating pregnancy, second trimester: Secondary | ICD-10-CM

## 2021-09-28 DIAGNOSIS — O0992 Supervision of high risk pregnancy, unspecified, second trimester: Secondary | ICD-10-CM

## 2021-09-28 DIAGNOSIS — O99210 Obesity complicating pregnancy, unspecified trimester: Secondary | ICD-10-CM

## 2021-09-28 DIAGNOSIS — O34219 Maternal care for unspecified type scar from previous cesarean delivery: Secondary | ICD-10-CM

## 2021-09-28 NOTE — Progress Notes (Signed)
[facility] [employer] utilized during today's visit 09/28/2021.  Delynn Flavin RN ?

## 2021-09-28 NOTE — Progress Notes (Signed)
Faxed U/S order to [facility]. Temp 96.9 orally. Wearing "pain patch" left flank from ER visit yesterday. Denies pain today. Delynn Flavin RN ?

## 2021-09-28 NOTE — Progress Notes (Signed)
21 w 1 day, Spanish Interpretor via [place], [place] [id number]. Delynn Flavin RN ?

## 2021-09-28 NOTE — Progress Notes (Signed)
[facility] Department ?Maternal Health Clinic ? ?PRENATAL VISIT NOTE ? ?Subjective:  ?Natalie Jenkins is a 21 y.o. G3P1011 at [redacted]w[redacted]d being seen today for ongoing prenatal care.  She is currently monitored for the following issues for this high-risk pregnancy and has Hx depression; Obesity affecting pregnancy BMI=36.6; history macrosomic infant 9#5; Supervision of high risk pregnancy in first trimester; Abnormal laboratory test TSH elevated and free T4 wnl, T3 low on 07/26/21; and  subclinical hypothyroidism 07/26/21 on their problem list. ? ?Patient reports  went to ER yesterday with gallbladder pain/stones .  Contractions: Not present. Vag. Bleeding: None.  Movement: Present. Denies leaking of fluid/ROM.  ? ?The following portions of the patient's history were reviewed and updated as appropriate: allergies, current medications, past family history, past medical history, past social history, past surgical history and problem list. Problem list updated. ? ?Objective:  ? ?Vitals:  ? 09/28/21 0913  ?BP: 111/69  ?Pulse: 77  ?Weight: 214 lb 9.6 oz (97.3 kg)  ? ? ?Fetal Status: Fetal Heart Rate (bpm): 140 Fundal Height: 23 cm Movement: Present    ? ?General:  Alert, oriented and cooperative. Patient is in no acute distress.  ?Skin: Skin is warm and dry. No rash noted.   ?Cardiovascular: Normal heart rate noted  ?Respiratory: Normal respiratory effort, no problems with respiration noted  ?Abdomen: Soft, gravid, appropriate for gestational age.  Pain/Pressure: Absent     ?Pelvic: Cervical exam deferred        ?Extremities: Normal range of motion.  Edema: None  ?Mental Status: Normal mood and affect. Normal behavior. Normal judgment and thought content.  ? ?Assessment and Plan:  ?Pregnancy: G3P1011 at [redacted]w[redacted]d ? ?1. history macrosomic infant 9#5 ? ? ?2. Obesity affecting pregnancy, antepartum ?-5 lb 6.4 oz (-2.449 kg) ?Taking ASA 81 mg daily ?Walking 4-5x/wk x 15-20 min ? ?3. Supervision of high risk pregnancy in  first trimester ?Went to ER last night for gallbladder pain/stones and was told needs evaluation by a surgeon and they would call her for apt because she might need surgery ?Pain is 0/10 now. Wearing a pain Lidocaine 4% patch on right flank which relieves. Has been craving chicken wings. ?AFP neg ?Gained 5 lbs in last 5 wks ?C/o sl rash under left breast: sl raised red papules pruritic x 1 week. No change in soap/detergent/meds. To try hydrocortisone cream  ? ? ?4. Abnormal laboratory test TSH elevated and free T4 wnl, T3 low on 07/26/21 ?Redraw TSH and free T3 today ?- T3, free ?- TSH ? ? ?Preterm labor symptoms and general obstetric precautions including but not limited to vaginal bleeding, contractions, leaking of fluid and fetal movement were reviewed in detail with the patient. ?Please refer to After Visit Summary for other counseling recommendations.  ?No follow-ups on file. ? ?Future Appointments  ?Date Time Provider [place]  ?10/26/2021 10:00 AM [facility] PROVIDER AC-[facility] None  ? ? ?Herbie Saxon, CNM ? ?

## 2021-09-29 LAB — TSH: TSH: 2.26 u[IU]/mL (ref 0.450–4.500)

## 2021-09-29 LAB — T3, FREE: T3, Free: 3.2 pg/mL (ref 2.0–4.4)

## 2021-09-30 ENCOUNTER — Telehealth: Payer: Self-pay | Admitting: Family Medicine

## 2021-09-30 NOTE — Telephone Encounter (Signed)
Call to client approximately 2.5 hours ago. Per client, began having bilateral breast pain yesterday. Client only able to describe pain as "like you want to feed the baby but can't". Client states breasts look normal and do not have any redness / red streaks and do not feel hot to touch. Denies fever. States that when she pinches the points (verified points = nipples), small amt of clear liquid comes out. Per consult with E. Sciora CNM, client counseled on the following: sounds like a normal finding as breasts are growing and developing what they need to hold milk for the baby, try cool wet washcloths / cool shower for breast discomfort, Tylenol as needed per bottle directions and if remains persistent problem, notify clinic. Client agreeable with plan. Roddie Mc [place] interpreted during carll. Jossie Ng, RN ? ?

## 2021-09-30 NOTE — Telephone Encounter (Signed)
Pt would like to speak to a nurse.  She is [redacted] weeks along and is having breast pain and discharge and she is wondering if that is normal or if something is wrong. ?

## 2021-10-19 ENCOUNTER — Telehealth: Payer: Self-pay | Admitting: Family Medicine

## 2021-10-19 NOTE — Telephone Encounter (Signed)
PT called said she took a covid test and its negative wanted nurse to know. ?

## 2021-10-19 NOTE — Telephone Encounter (Signed)
Call to client with Natalie Jenkins. Client reports 10/08/2021 onset of itchy watery eyes that are swollen with somewhat red sclera, itchy watery nose, itchy ears and very itchy throat. Denies sore throat and states using Chloraseptic Spray to numb throat so will not itch so much. Also taking regular strength Tylenol. Reports 100.1 fever x2 with last early this am. Per consult with E. Sciora CNM, client needs Covid test and to notify [facility] if positive. If Covid test negative, may begin either Claritin or Zyrtec per Safe Medications in Pregnancy list (verified with client she has list). Client counseled regarding above and agreeable with plan. Counseled free Covid test kits available at [facility] at 1st floor elevator (requested someone pick the test kits up for her). Jossie Ng, RN ? ?

## 2021-10-19 NOTE — Telephone Encounter (Signed)
Call to client and counseled to begin Claritin or Zytec from Safe Medication in Pregnancy list. Jossie Ng, RN ? ?

## 2021-10-19 NOTE — Telephone Encounter (Signed)
Has had a bad cold/flu for the past 11-12 days and has been taking meds but nothing is helping and she would like to talk to a nurse about what her options are so that it doesn't get worse. ?

## 2021-10-21 ENCOUNTER — Ambulatory Visit: Admit: 2021-10-21 | Discharge: 2021-10-22 | Payer: PRIVATE HEALTH INSURANCE

## 2021-10-26 ENCOUNTER — Ambulatory Visit: Payer: Medicaid Other | Admitting: Family Medicine

## 2021-10-26 VITALS — BP 107/67 | HR 87 | Temp 97.4°F | Wt 218.4 lb

## 2021-10-26 DIAGNOSIS — O0992 Supervision of high risk pregnancy, unspecified, second trimester: Secondary | ICD-10-CM

## 2021-10-26 DIAGNOSIS — O99210 Obesity complicating pregnancy, unspecified trimester: Secondary | ICD-10-CM

## 2021-10-26 DIAGNOSIS — O99212 Obesity complicating pregnancy, second trimester: Secondary | ICD-10-CM

## 2021-10-26 DIAGNOSIS — O0991 Supervision of high risk pregnancy, unspecified, first trimester: Secondary | ICD-10-CM

## 2021-10-26 DIAGNOSIS — K8020 Calculus of gallbladder without cholecystitis without obstruction: Secondary | ICD-10-CM

## 2021-10-26 NOTE — Progress Notes (Signed)
Here today for 25.1 week MH RV. Taking PNV and ASA QD. Denies ED/hospital visits since last RV. Has [facility] growth scan scheduled for 12/16/21 @ 9:00 and plans to keep. PTL s/s info given. CCNC, PHQ 9 forms today. Tawny Hopping, RN ? ?

## 2021-10-27 ENCOUNTER — Telehealth: Payer: Self-pay | Admitting: Family Medicine

## 2021-10-27 DIAGNOSIS — K8050 Calculus of bile duct without cholangitis or cholecystitis without obstruction: Principal | ICD-10-CM

## 2021-10-27 NOTE — Progress Notes (Signed)
[facility] Department ?Maternal Health Clinic ? ?PRENATAL VISIT NOTE ? ?Subjective:  ?Natalie Jenkins is a 21 y.o. G3P1011 at [redacted]w[redacted]d being seen today for ongoing prenatal care.  She is currently monitored for the following issues for this high-risk pregnancy and has Hx depression; Obesity affecting pregnancy BMI=36.6; history macrosomic infant 9#5; Supervision of high risk pregnancy in first trimester; Abnormal laboratory test TSH elevated and free T4 wnl, T3 low on 07/26/21; and  subclinical hypothyroidism 07/26/21 on their problem list. ? ?Patient reports  increase gall bladder pain between 2-4 hours .  Contractions: Irritability. Vag. Bleeding: None.  Movement: Present. Denies leaking of fluid/ROM.  ? ?The following portions of the patient's history were reviewed and updated as appropriate: allergies, current medications, past family history, past medical history, past social history, past surgical history and problem list. Problem list updated. ? ?Objective:  ? ?Vitals:  ? 10/26/21 1019  ?BP: 107/67  ?Pulse: 87  ?Temp: (!) 97.4 ?F (36.3 ?C)  ?Weight: 218 lb 6.4 oz (99.1 kg)  ? ? ?Fetal Status: Fetal Heart Rate (bpm): 140 Fundal Height: 27 cm Movement: Present    ? ?General:  Alert, oriented and cooperative. Patient is in no acute distress.  ?Skin: Skin is warm and dry. No rash noted.   ?Cardiovascular: Normal heart rate noted  ?Respiratory: Normal respiratory effort, no problems with respiration noted  ?Abdomen: Soft, gravid, appropriate for gestational age.  Pain/Pressure: Present     ?Pelvic: Cervical exam deferred        ?Extremities: Normal range of motion.  Edema: None  ?Mental Status: Normal mood and affect. Normal behavior. Normal judgment and thought content.  ? ?Assessment and Plan:  ?Pregnancy: G3P1011 at [redacted]w[redacted]d ? ?1. Supervision of high risk pregnancy in first trimester ?TWG- -1 lb  ?Taking PNV and ASA as directed  ?U/S done 10/21/21- reviewed  ?Fetal growth U/S ~30-32 weeks on  12/16/21 ?Rash with breast has cleared up PNV- ?Plans to breast feed- discussed lactation consultant if needed  ?Pediatrician-  [facility]  ? PP contraception undecided ?Expected pains of back and broad ligament   ?28  week labs at next appointment in 3 weeks.  ? ?2. Obesity affecting pregnancy, antepartum ?Discussed diet and exercise ?Discussed Weight gain of 25-35lbs as appropriate based on BMI  ? ?3. Gall bladder stones ?Pt reports that pain(10/10) with gall stones has increased, pt reports having persistent pain that has increased from2 hrs. Day before and last pm 4 hours.  Pt did not go to ED.     ? ?Pt has imaging on 09/27/21 and is waiting on surgeon to call back with plan, at this time she has not has contact with anyone about gall stones.   ? ?Per [facility] referral to general surgery was ordered on 09/27/21.   ?Contacted [facility] family medicine resident for consult d/t pt increase in pain and and duration of pain.  ? ? ?Preterm labor symptoms and general obstetric precautions including but not limited to vaginal bleeding, contractions, leaking of fluid and fetal movement were reviewed in detail with the patient. ?Please refer to After Visit Summary for other counseling recommendations.  ?Return in about 3 weeks (around 11/16/2021). ? ?Future Appointments  ?Date Time Provider [place]  ?11/16/2021 10:40 AM [facility] PROVIDER [facility] None  ? ? ?[facility] agency interpreter used for Spanish interpretation.     ?Junious Dresser, FNP ? ?

## 2021-10-27 NOTE — Telephone Encounter (Signed)
Call to [facility] Family medicine resident (Dr. Mitchell Heir) to consult about pt and increase of pain and duration of pain with gall stones.  She will resend referral for surgical consult for this patient.  Also recommended having back up plan incase general Sx is unable to see this patient in a urgent time frame. ? ?Wendi Snipes, FNP ? ?

## 2021-10-28 ENCOUNTER — Encounter: Payer: Self-pay | Admitting: Physician Assistant

## 2021-10-28 DIAGNOSIS — K8020 Calculus of gallbladder without cholecystitis without obstruction: Secondary | ICD-10-CM | POA: Insufficient documentation

## 2021-11-03 ENCOUNTER — Encounter: Payer: Self-pay | Admitting: Family Medicine

## 2021-11-03 NOTE — Progress Notes (Unsigned)
Patient ID: Amedeo Gory, female   DOB: [birth year], 21 y.o.   MRN: [id number] ?[facility] Department Maternity Care Conference ? ?Maternity Care Conference Date: 11/03/21 ? ?Emi Rula [place] was identified by clinical staff to benefit from an interdisciplinary team approach to help improve pregnancy care.  The [facility] Maternity Care Conference includes the maternity clinic coordinator (RN), medical providers (MD/APP staff), Care Management -OBCM and Healthy Beginnings, Centering Pregnancy coordinator, Infant Mortality reduction Dietitian.  Nursing staff are also encouraged to participate. The group meets monthly to discuss patient care and coordinate services.  ? ?The patient's care care at the agency was reviewed in EMR and high risk factors evaluated in an interdisciplinary approach.   ? ?Value added interventions discussed at this care conference today were: ? ?[facility] Meeting 10/28/21 ?Patient has had gallstone issues and has not heard back from doctor.  ?Provider advised patient to go the ER if needed. Provider got into contact with [facility] provider for patient. Plan needs to be set up incase [facility] is unable to see patient.  ?Patient will be open to case management and will update provider.  ?L.Rojas  ?

## 2021-11-16 ENCOUNTER — Ambulatory Visit: Payer: Medicaid Other | Admitting: Advanced Practice Midwife

## 2021-11-16 VITALS — BP 100/61 | HR 88 | Temp 96.7°F | Wt 222.2 lb

## 2021-11-16 DIAGNOSIS — E038 Other specified hypothyroidism: Secondary | ICD-10-CM

## 2021-11-16 DIAGNOSIS — F32A Depression, unspecified: Secondary | ICD-10-CM

## 2021-11-16 DIAGNOSIS — O99210 Obesity complicating pregnancy, unspecified trimester: Secondary | ICD-10-CM

## 2021-11-16 DIAGNOSIS — Z23 Encounter for immunization: Secondary | ICD-10-CM

## 2021-11-16 DIAGNOSIS — K8020 Calculus of gallbladder without cholecystitis without obstruction: Secondary | ICD-10-CM

## 2021-11-16 DIAGNOSIS — O0991 Supervision of high risk pregnancy, unspecified, first trimester: Secondary | ICD-10-CM

## 2021-11-16 LAB — HEMOGLOBIN, FINGERSTICK: Hemoglobin: 11.1 g/dL (ref 11.1–15.9)

## 2021-11-16 NOTE — Progress Notes (Signed)
Here today for 28.1 week MH RV. Taking PNV and ASA QD. Denies ED/hospital visits since last RV. Aware of 12/16/21 [facility] growth scan. 28 week labs and Tdap today. Tawny Hopping, RN

## 2021-11-16 NOTE — Progress Notes (Signed)
Tdap given. Hgb 11.1. Tawny Hopping, RN

## 2021-11-16 NOTE — Progress Notes (Signed)
[place] Department Maternal Health Clinic  PRENATAL VISIT NOTE  Subjective:  Natalie Jenkins is a 21 y.o. G3P1011 at [redacted]w[redacted]d being seen today for ongoing prenatal care.  She is currently monitored for the following issues for this high-risk pregnancy and has Hx depression; Obesity affecting pregnancy BMI=36.6; history macrosomic infant 9#5; Supervision of high risk pregnancy in first trimester; Abnormal laboratory test TSH elevated and free T4 wnl, T3 low on 07/26/21;  subclinical hypothyroidism 07/26/21; and Gallstones on their problem list.  Patient reports  swollen labia and painful private parts .  Contractions: Not present. Vag. Bleeding: None.  Movement: Present. Denies leaking of fluid/ROM.   The following portions of the patient's history were reviewed and updated as appropriate: allergies, current medications, past family history, past medical history, past social history, past surgical history and problem list. Problem list updated.  Objective:   Vitals:   11/16/21 1044  BP: 100/61  Pulse: 88  Temp: (!) 96.7 F (35.9 C)  Weight: 222 lb 3.2 oz (100.8 kg)    Fetal Status: Fetal Heart Rate (bpm): 160 Fundal Height: 29 cm Movement: Present     General:  Alert, oriented and cooperative. Patient is in no acute distress.  Skin: Skin is warm and dry. No rash noted.   Cardiovascular: Normal heart rate noted  Respiratory: Normal respiratory effort, no problems with respiration noted  Abdomen: Soft, gravid, appropriate for gestational age.  Pain/Pressure: Absent     Pelvic: Cervical exam deferred        Extremities: Normal range of motion.  Edema: None  Mental Status: Normal mood and affect. Normal behavior. Normal judgment and thought content.   Assessment and Plan:  Pregnancy: G3P1011 at [redacted]w[redacted]d  1.  subclinical hypothyroidism 07/26/21 Last labs on 09/28/21 wnl  2. Supervision of high risk pregnancy in first trimester Not working but 1 yo child not letting her  sleep at night 1 hour glucola today Has growth u/s 12/16/21 C/o swollen, painful external genitalia when sitting or walking x 3 wks--no edema, no varicosities, common discomforts of pregnancy--suggestions given - Glucose, 1 hour gestational - RPR - HIV-1/HIV-2 Qualitative RNA - Hemoglobin, venipuncture  3. Obesity affecting pregnancy, antepartum 2 lb 3.2 oz (0.998 kg) Walking 3-4x/wk x 15-20 min Taking ASA 81 mg daily  4. Depression, unspecified depression type Denies sxs  5. Gallstones States is doing better with changing her diet and eating less greasy, fatty, fried foods Had imagine 09/27/21 and was referred to general surgery  on 09/27/21. Hilario Quarry, FNP contacted [facility] family medicine resident due to pain and now pt has apt with MD on 11/22/21   Preterm labor symptoms and general obstetric precautions including but not limited to vaginal bleeding, contractions, leaking of fluid and fetal movement were reviewed in detail with the patient. Please refer to After Visit Summary for other counseling recommendations.  Return in about 2 weeks (around 11/30/2021) for routine PNC.  No future appointments.  Herbie Saxon, CNM

## 2021-11-17 LAB — HIV-1/HIV-2 QUALITATIVE RNA
HIV-1 RNA, Qualitative: NONREACTIVE
HIV-2 RNA, Qualitative: NONREACTIVE

## 2021-11-17 LAB — GLUCOSE, 1 HOUR GESTATIONAL: Gestational Diabetes Screen: 115 mg/dL (ref 70–139)

## 2021-11-17 LAB — RPR: RPR Ser Ql: NONREACTIVE

## 2021-11-30 ENCOUNTER — Ambulatory Visit: Admit: 2021-11-30 | Discharge: 2021-12-01 | Payer: PRIVATE HEALTH INSURANCE

## 2021-11-30 ENCOUNTER — Ambulatory Visit: Admit: 2021-11-30 | Discharge: 2021-12-05 | Payer: PRIVATE HEALTH INSURANCE

## 2021-11-30 ENCOUNTER — Ambulatory Visit: Payer: Medicaid Other | Admitting: Advanced Practice Midwife

## 2021-11-30 ENCOUNTER — Telehealth: Payer: Self-pay

## 2021-11-30 DIAGNOSIS — O99210 Obesity complicating pregnancy, unspecified trimester: Secondary | ICD-10-CM

## 2021-11-30 DIAGNOSIS — K8020 Calculus of gallbladder without cholecystitis without obstruction: Secondary | ICD-10-CM

## 2021-11-30 DIAGNOSIS — E038 Other specified hypothyroidism: Secondary | ICD-10-CM

## 2021-11-30 DIAGNOSIS — O0991 Supervision of high risk pregnancy, unspecified, first trimester: Secondary | ICD-10-CM

## 2021-11-30 DIAGNOSIS — O0993 Supervision of high risk pregnancy, unspecified, third trimester: Secondary | ICD-10-CM

## 2021-11-30 DIAGNOSIS — O99213 Obesity complicating pregnancy, third trimester: Secondary | ICD-10-CM

## 2021-11-30 DIAGNOSIS — R899 Unspecified abnormal finding in specimens from other organs, systems and tissues: Secondary | ICD-10-CM

## 2021-11-30 NOTE — Progress Notes (Signed)
Here today for 30.1 week MH RV. Taking PNV and ASA QD. Denies ED/hospital visits since last RV. Complains of vaginal pressure and rash ans itching under breasts. States she has tried "Cortizone cream under breasts but has not helped." Kick count cards and instructions given. Hal Morales, RN

## 2021-11-30 NOTE — Telephone Encounter (Signed)
Call transferred to clinic and interpretation by Lane Hacker. Client with EGA = 30 1/7 and seen late am in [facility] today. Client with complaint of leakage of fluid via her vagina that is not urine. States it is clear like water and has no smell. States it is not sticky. States water is slowly running down her leg in small amounts. RN questioned how wet panties were and she stated not wearing them as recently had intercourse. Client states the liquid is not sperm. Consult with Hazle Coca CNM regarding above and client referred to [facility] for evaluation. Per client, she will go to [facility] for evaluation. Jossie Ng, RN

## 2021-11-30 NOTE — Progress Notes (Signed)
[place] Department Maternal Health Clinic  PRENATAL VISIT NOTE  Subjective:  Natalie Jenkins is a 21 y.o. G3P1011 at [redacted]w[redacted]d being seen today for ongoing prenatal care.  She is currently monitored for the following issues for this high-risk pregnancy and has Hx depression; Obesity affecting pregnancy BMI=36.6; history macrosomic infant 9#5; Supervision of high risk pregnancy in first trimester; Abnormal laboratory test TSH elevated and free T4 wnl, T3 low on 07/26/21;  subclinical hypothyroidism 07/26/21; and Gallstones on their problem list.  Patient reports  rash under breasts and continued vaginal pressure .  Contractions: Not present. Vag. Bleeding: None.  Movement: Present. Denies leaking of fluid/ROM.   The following portions of the patient's history were reviewed and updated as appropriate: allergies, current medications, past family history, past medical history, past social history, past surgical history and problem list. Problem list updated.  Objective:  There were no vitals filed for this visit.  Fetal Status: Fetal Heart Rate (bpm): 130 Fundal Height: 31 cm Movement: Present     General:  Alert, oriented and cooperative. Patient is in no acute distress.  Skin: Skin is warm and dry. No rash noted.   Cardiovascular: Normal heart rate noted  Respiratory: Normal respiratory effort, no problems with respiration noted  Abdomen: Soft, gravid, appropriate for gestational age.  Pain/Pressure: Absent     Pelvic: Cervical exam deferred        Extremities: Normal range of motion.  Edema: None  Mental Status: Normal mood and affect. Normal behavior. Normal judgment and thought content.   Assessment and Plan:  Pregnancy: G3P1011 at [redacted]w[redacted]d  1. Supervision of high risk pregnancy in first trimester Not working C/o rash under breasts this week that itches. No rash visible; only a few pin point brown dots under left breast without erythema or dry skin; uses Dove soap 1x/day.  To try Clotrimazole cream BID Continuing to c/o vaginal pressure--suggestions discussed from last apt. Hx 9#5 baby and normal discomforts of pregnancy  2. Obesity affecting pregnancy, antepartum 2 lb 3.2 oz (0.998 kg) 4 lb wt gain in past 4 wks Walking 3x/wk x 15-20 min Taking ASA 81 mg daily  3. Abnormal laboratory test TSH elevated and free T4 wnl, T3 low on 07/26/21 Last labs wnl  4.  subclinical hypothyroidism 07/26/21 Last labs on 09/28/21 wnl  5. Gallstones DNKA 11/22/21 apt because couldn't bring her daughter Rescheduled [facility] general bariatric surgery apt for 12/22/21   Preterm labor symptoms and general obstetric precautions including but not limited to vaginal bleeding, contractions, leaking of fluid and fetal movement were reviewed in detail with the patient. Please refer to After Visit Summary for other counseling recommendations.  Return in about 2 weeks (around 12/14/2021) for routine PNC.  No future appointments.  Herbie Saxon, CNM

## 2021-12-14 ENCOUNTER — Ambulatory Visit: Payer: Medicaid Other | Admitting: Advanced Practice Midwife

## 2021-12-14 VITALS — BP 90/53 | Wt 224.4 lb

## 2021-12-14 DIAGNOSIS — O99210 Obesity complicating pregnancy, unspecified trimester: Secondary | ICD-10-CM

## 2021-12-14 DIAGNOSIS — O34219 Maternal care for unspecified type scar from previous cesarean delivery: Secondary | ICD-10-CM

## 2021-12-14 DIAGNOSIS — K8020 Calculus of gallbladder without cholecystitis without obstruction: Secondary | ICD-10-CM

## 2021-12-14 DIAGNOSIS — Z3689 Encounter for other specified antenatal screening: Secondary | ICD-10-CM

## 2021-12-14 DIAGNOSIS — O99213 Obesity complicating pregnancy, third trimester: Secondary | ICD-10-CM

## 2021-12-14 DIAGNOSIS — O0993 Supervision of high risk pregnancy, unspecified, third trimester: Secondary | ICD-10-CM

## 2021-12-14 DIAGNOSIS — O0991 Supervision of high risk pregnancy, unspecified, first trimester: Secondary | ICD-10-CM

## 2021-12-16 ENCOUNTER — Ambulatory Visit: Admit: 2021-12-16 | Discharge: 2021-12-17 | Payer: PRIVATE HEALTH INSURANCE

## 2021-12-28 ENCOUNTER — Encounter: Payer: Self-pay | Admitting: Advanced Practice Midwife

## 2021-12-28 ENCOUNTER — Ambulatory Visit: Payer: Medicaid Other | Admitting: Advanced Practice Midwife

## 2021-12-28 VITALS — BP 121/62 | HR 103 | Temp 97.3°F | Wt 230.0 lb

## 2021-12-28 DIAGNOSIS — O0993 Supervision of high risk pregnancy, unspecified, third trimester: Secondary | ICD-10-CM

## 2021-12-28 DIAGNOSIS — O99213 Obesity complicating pregnancy, third trimester: Secondary | ICD-10-CM

## 2021-12-28 DIAGNOSIS — O99210 Obesity complicating pregnancy, unspecified trimester: Secondary | ICD-10-CM

## 2021-12-28 DIAGNOSIS — O0991 Supervision of high risk pregnancy, unspecified, first trimester: Secondary | ICD-10-CM

## 2021-12-28 DIAGNOSIS — K8020 Calculus of gallbladder without cholecystitis without obstruction: Secondary | ICD-10-CM

## 2021-12-28 DIAGNOSIS — Z3689 Encounter for other specified antenatal screening: Secondary | ICD-10-CM

## 2021-12-28 DIAGNOSIS — D509 Iron deficiency anemia, unspecified: Secondary | ICD-10-CM | POA: Insufficient documentation

## 2021-12-28 DIAGNOSIS — O34219 Maternal care for unspecified type scar from previous cesarean delivery: Secondary | ICD-10-CM

## 2021-12-28 DIAGNOSIS — O99013 Anemia complicating pregnancy, third trimester: Secondary | ICD-10-CM

## 2021-12-28 DIAGNOSIS — F32A Depression, unspecified: Secondary | ICD-10-CM

## 2021-12-28 LAB — URINALYSIS
Bilirubin, UA: NEGATIVE
Glucose, UA: NEGATIVE
Ketones, UA: NEGATIVE
Nitrite, UA: NEGATIVE
Protein,UA: NEGATIVE
RBC, UA: NEGATIVE
Specific Gravity, UA: 1.02 (ref 1.005–1.030)
Urobilinogen, Ur: 0.2 mg/dL (ref 0.2–1.0)
pH, UA: 7 (ref 5.0–7.5)

## 2021-12-28 LAB — HEMOGLOBIN, FINGERSTICK: Hemoglobin: 10.9 g/dL — ABNORMAL LOW (ref 11.1–15.9)

## 2021-12-28 MED ORDER — FERROUS SULFATE 325 (65 FE) MG PO TABS: 325.0000 mg | ORAL_TABLET | Freq: Every day | ORAL | 3 refills | Status: AC

## 2021-12-28 NOTE — Progress Notes (Signed)
[facility] Department Maternal Health Clinic  PRENATAL VISIT NOTE  Subjective:  Natalie Jenkins is a 21 y.o. G3P1011 at [redacted]w[redacted]d being seen today for ongoing prenatal care.  She is currently monitored for the following issues for this high-risk pregnancy and has Hx depression; Obesity affecting pregnancy BMI=36.6; history macrosomic infant 9#5; Supervision of high risk pregnancy in first trimester; Abnormal laboratory test TSH elevated and free T4 wnl, T3 low on 07/26/21;  subclinical hypothyroidism 07/26/21; and Gallstones on their problem list.  Patient reports  numbness and itching of legs, feet, hands, and arms x 3 days .  Contractions: Not present. Vag. Bleeding: None.  Movement: Present. Denies leaking of fluid/ROM.   The following portions of the patient's history were reviewed and updated as appropriate: allergies, current medications, past family history, past medical history, past social history, past surgical history and problem list. Problem list updated.  Objective:   Vitals:   12/28/21 1040  BP: 121/62  Pulse: (!) 103  Temp: (!) 97.3 F (36.3 C)  Weight: 230 lb (104.3 kg)    Fetal Status: Fetal Heart Rate (bpm): 145 Fundal Height: 35 cm Movement: Present     General:  Alert, oriented and cooperative. Patient is in no acute distress.  Skin: Skin is warm and dry. No rash noted.   Cardiovascular: Normal heart rate noted  Respiratory: Normal respiratory effort, no problems with respiration noted  Abdomen: Soft, gravid, appropriate for gestational age.  Pain/Pressure: Absent     Pelvic: Cervical exam deferred        Extremities: Normal range of motion.  Edema: None  Mental Status: Normal mood and affect. Normal behavior. Normal judgment and thought content.   Assessment and Plan:  Pregnancy: G3P1011 at [redacted]w[redacted]d  1. Gallstones See notes under #3  2. Obesity affecting pregnancy, antepartum Taking ASA 81 mg daily 10 lb (4.536 kg) 6 lb wt gain in past 2  wks Breakfast this am: glass of milk and 1 package of cookies Dinner last night: beef, beans, watermelon juice Walking 3-4x/wk x 30-40 min  3. Supervision of high risk pregnancy in first trimester Not working Has car seat, diapers, clothes for baby Pt has gallstones and had general surgery consult 11/22/21 which she DNKA'd Rescheduled to 12/22/21 which pt rescheduled to 01/26/22 Reviewed 12/16/21 u/s at 32 6/7 with AFI wnl, EFW=28%, anterior placenta, growth wnl C/o numbness and itching of legs, feet, hands, arms x 3 days; bathes with Dove soap 1x/day and using Lubriderm lotion on body x 1 mo; to use tepid water to bathe and stop using Lubriderm. Bile acids drawn today. No one else in house is itching - Urinalysis (Urine Dip) - Protein / creatinine ratio, urine  (Spot) - Hemoglobin, venipuncture - Bile acids, total  4. history macrosomic infant 9#5   5. Depression, unspecified depression type Denies sxs   Preterm labor symptoms and general obstetric precautions including but not limited to vaginal bleeding, contractions, leaking of fluid and fetal movement were reviewed in detail with the patient. Please refer to After Visit Summary for other counseling recommendations.  Return in about 2 weeks (around 01/11/2022) for routine PNC, 36 wk labs.  No future appointments.  Alberteen Spindle, CNM

## 2021-12-28 NOTE — Progress Notes (Signed)
Hgb reviewed during clinic visit.   Per standing order: Dispensed ferrous sulfate 325mg and given to patient.   Instructed to take one tablet daily with juice that has Vit C such as orange, apple, or grape juice.  Anemia panel order added.  Anemia in pregnancy added to problem list.  Counsel patient on Fe-rich foods and Anemia in Pregnancy Pamphlet given.   Patient verbalized understanding. Questions answered.   Chriselda Leppert Ramos, RN  

## 2021-12-29 LAB — FE+CBC/D/PLT+TIBC+FER+RETIC
Basophils Absolute: 0.1 10*3/uL (ref 0.0–0.2)
Basos: 0 %
EOS (ABSOLUTE): 0.1 10*3/uL (ref 0.0–0.4)
Eos: 1 %
Ferritin: 13 ng/mL — ABNORMAL LOW (ref 15–150)
Hematocrit: 32.6 % — ABNORMAL LOW (ref 34.0–46.6)
Hemoglobin: 10.8 g/dL — ABNORMAL LOW (ref 11.1–15.9)
Immature Grans (Abs): 0.1 10*3/uL (ref 0.0–0.1)
Immature Granulocytes: 1 %
Iron Saturation: 17 % (ref 15–55)
Iron: 106 ug/dL (ref 27–159)
Lymphocytes Absolute: 2.4 10*3/uL (ref 0.7–3.1)
Lymphs: 17 %
MCH: 27.8 pg (ref 26.6–33.0)
MCHC: 33.1 g/dL (ref 31.5–35.7)
MCV: 84 fL (ref 79–97)
Monocytes Absolute: 0.7 10*3/uL (ref 0.1–0.9)
Monocytes: 5 %
Neutrophils Absolute: 10.9 10*3/uL — ABNORMAL HIGH (ref 1.4–7.0)
Neutrophils: 76 %
Platelets: 442 10*3/uL (ref 150–450)
RBC: 3.89 x10E6/uL (ref 3.77–5.28)
RDW: 13.1 % (ref 11.7–15.4)
Retic Ct Pct: 2.2 % (ref 0.6–2.6)
Total Iron Binding Capacity: 609 ug/dL (ref 250–450)
UIBC: 503 ug/dL — ABNORMAL HIGH (ref 131–425)
WBC: 14.3 10*3/uL — ABNORMAL HIGH (ref 3.4–10.8)

## 2021-12-29 LAB — PROTEIN / CREATININE RATIO, URINE
Creatinine, Urine: 64.2 mg/dL
Protein, Ur: 7.6 mg/dL
Protein/Creat Ratio: 118 mg/g creat (ref 0–200)

## 2021-12-29 LAB — BILE ACIDS, TOTAL: Bile Acids Total: 3 umol/L (ref 0.0–10.0)

## 2022-01-04 IMAGING — US US OB < 14 WEEKS - US OB TV
1 series · 13 of 28 positions shown · non-contrast
Comparison: None.

CLINICAL DATA: Vaginal bleeding for 1 day, quantitative beta hCG
131

EXAM:
OBSTETRIC <14 WK US AND TRANSVAGINAL OB US
TECHNIQUE: Both transabdominal and transvaginal ultrasound examinations were
performed for complete evaluation of the gestation as well as the
maternal uterus, adnexal regions, and pelvic cul-de-sac.
Transvaginal technique was performed to assess early pregnancy.

[Series 1: us ob less than 14 weeks with ob transvaginal · 13 of 97 slices shown]
[im 4/97]
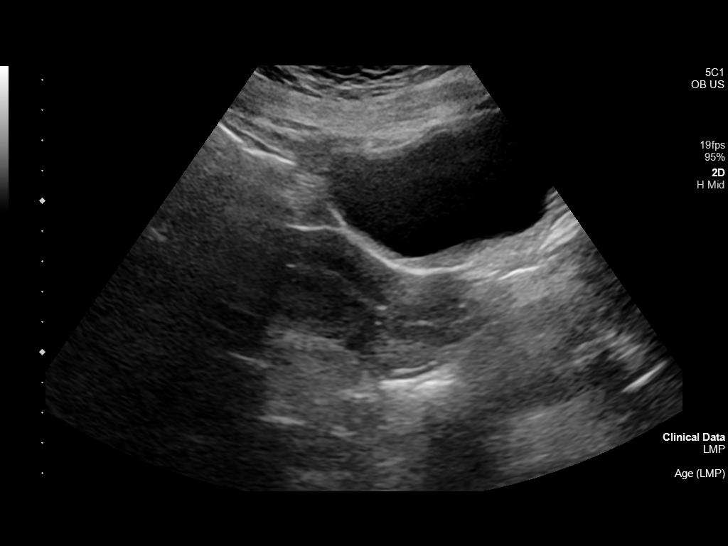
[im 11/97]
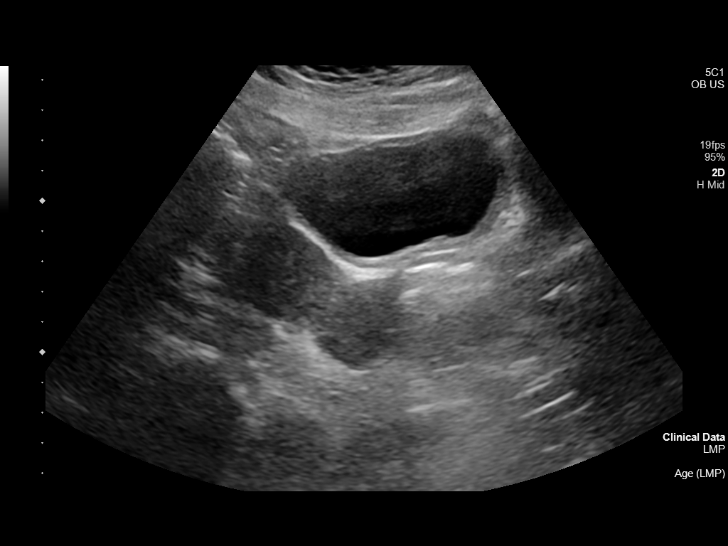
[im 18/97]
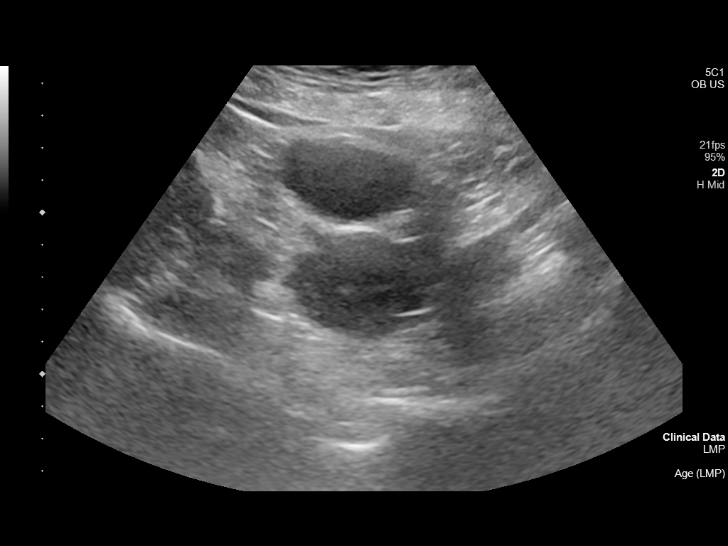
[im 25/97]
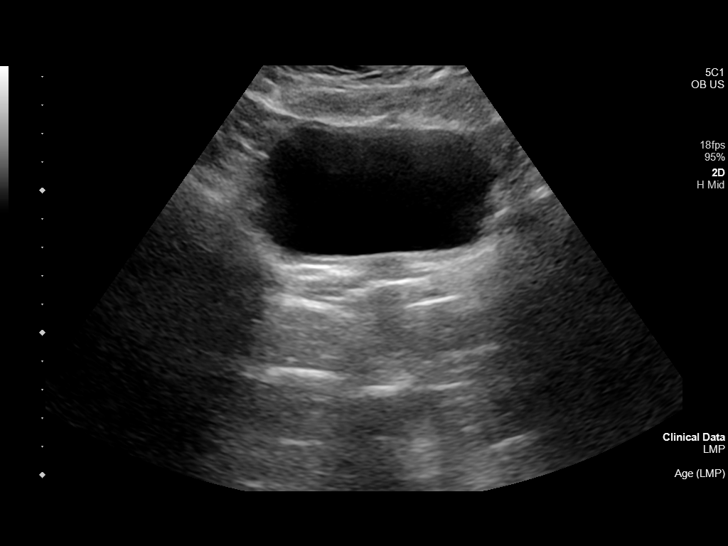
[im 33/97]
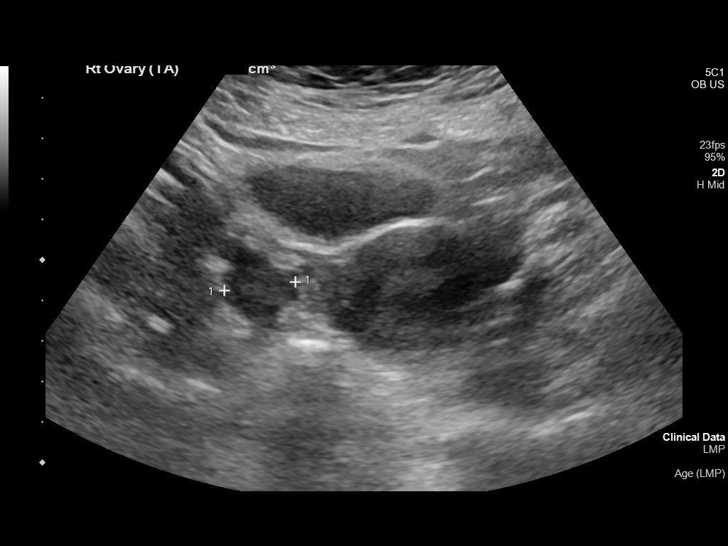
[im 40/97]
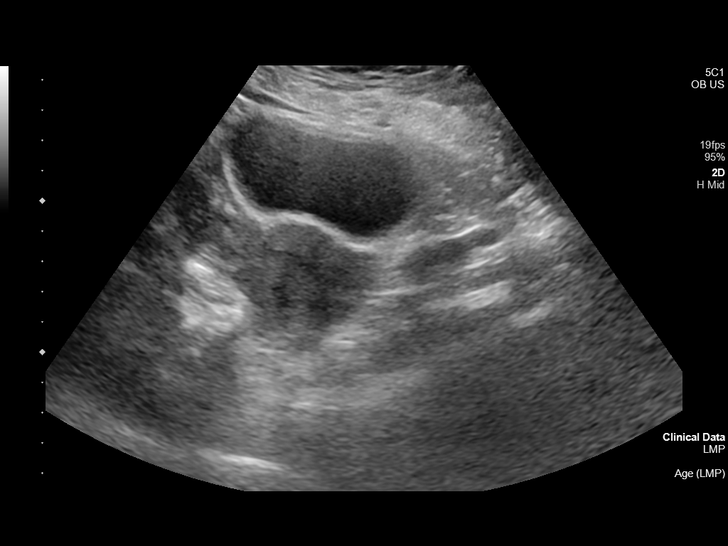
[im 50/97]
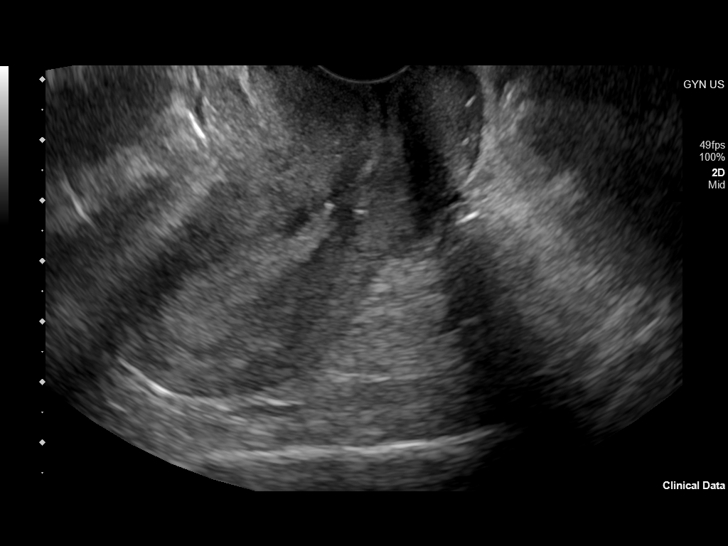
[im 57/97]
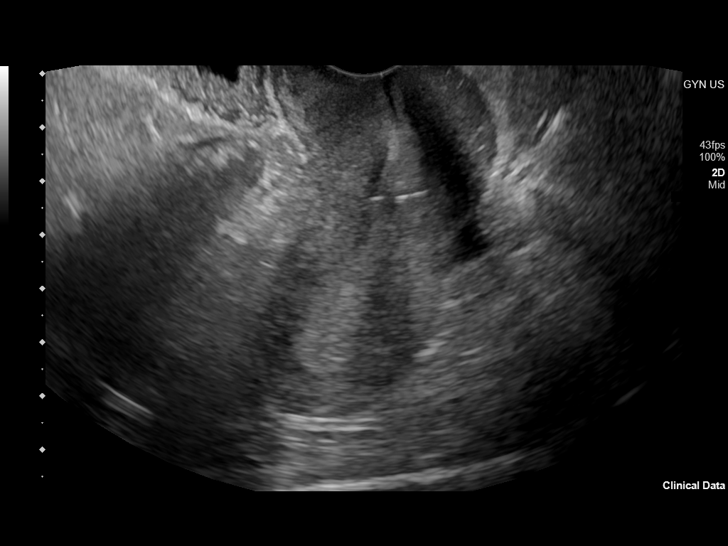
[im 65/97]
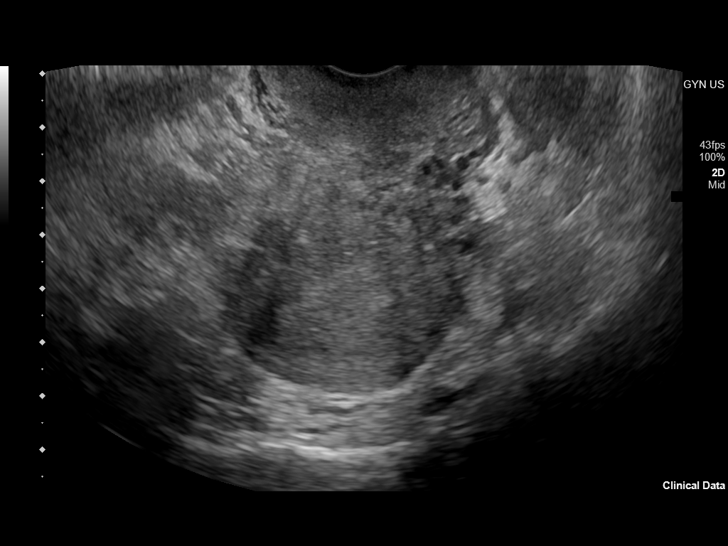
[im 72/97]
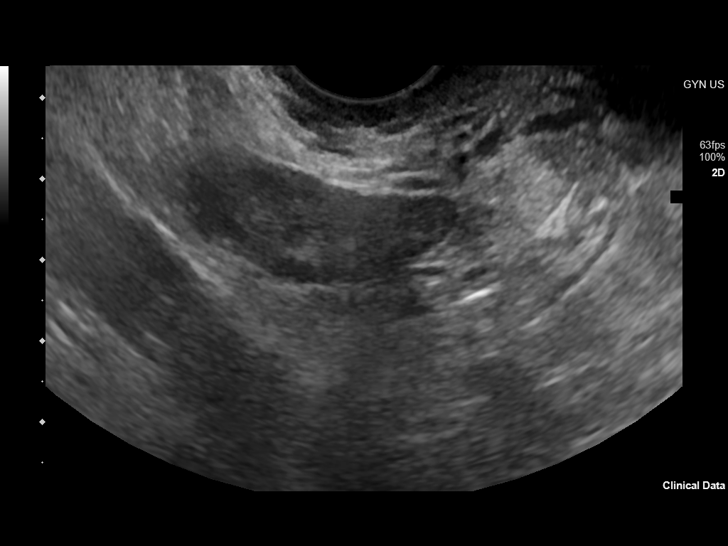
[im 79/97]
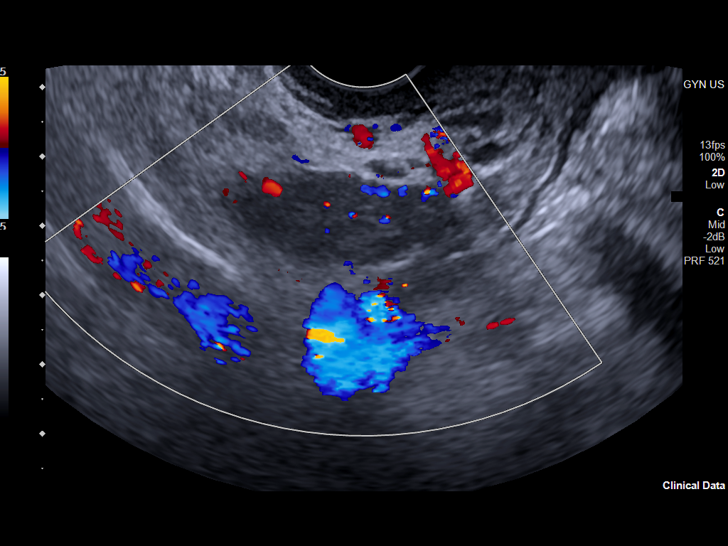
[im 86/97]
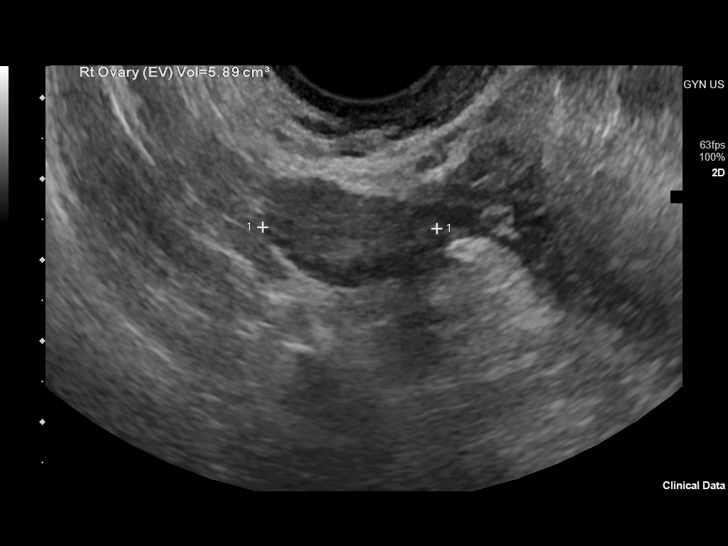
[im 93/97]
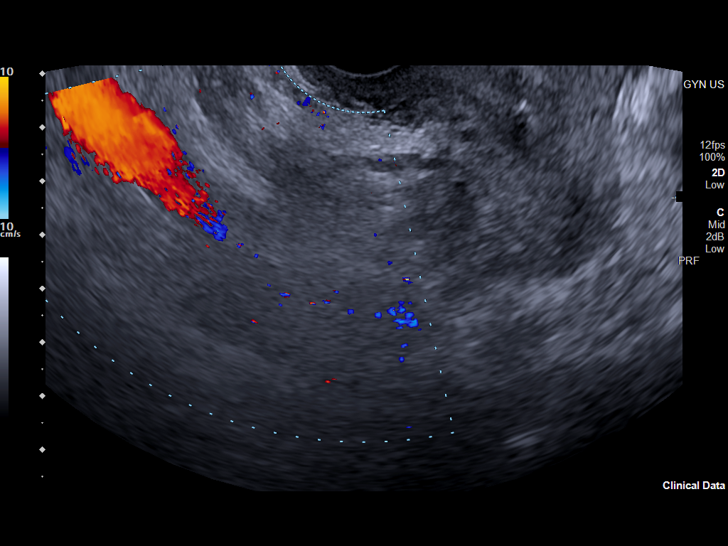

[13 of 28 positions shown; findings below may reference images not displayed]

FINDINGS: Intrauterine gestational sac: None

Yolk sac:  Not Visualized.

Embryo:  Not Visualized.

Cardiac Activity: Not Visualized.

Maternal uterus/adnexae: Anteverted maternal uterus. Endometrial
thickness of 11 mm. No focal endometrial collection to suggest early
gestational sac. Ovaries are unremarkable with a left ovary
visualized transabdominally only. No adnexal mass or collection.
Trace anechoic free fluid in the deep pelvis is nonspecific and
often physiologic in a reproductive age female.
IMPRESSION: No intrauterine pregnancy visualized. Differential considerations
would include early intrauterine pregnancy too early to visualize,
spontaneous abortion, or occult ectopic pregnancy. Recommend close
clinical followup and serial quantitative beta HCGs and ultrasounds.

Trace anechoic free fluid in the deep pelvis is nonspecific though
often physiologic in a reproductive age female.

## 2022-01-05 ENCOUNTER — Telehealth: Payer: Self-pay | Admitting: Family Medicine

## 2022-01-05 NOTE — Telephone Encounter (Signed)
Return call to client with [facility] Interpreters ID # [phone]. Client with c/o intermittent "left vaginal lip pain" that started ~ 1700 yesterday. States pain occurs rapidly like a "quick shot" and feels like a muscle is tearing. Rates pain as an 8. Denies rash or lump in vaginal area. Denies contractions / vaginal bleeding / change in vaginal discharge. Reports warm compress, cold compress and Tylenol have not relieved symptoms. Endorses fetal movement as usual. Last sex 2 weeks ago. Hazle Coca CNM consulted regarding above and client counseled on the following per her recommendation: 1) stop sleeping on left side and sleep on right side, 2) warm bath and 3) if no relief from above, to hospital for evaluation. Counseled that #1 and #2 may work to help shift baby if baby lying on a nerve. Client agreeable with plan. Jossie Ng, RN

## 2022-01-05 NOTE — Telephone Encounter (Signed)
PT called about pulling at the bottom of belly.

## 2022-01-05 NOTE — Telephone Encounter (Signed)
Consulted on the plan of care for this client.  I agree with the documented note and actions taken to provide care for this client.  E. Ewin Rehberg, CNM  

## 2022-01-05 NOTE — Telephone Encounter (Signed)
Consulted on the plan of care for this client.  I agree with the documented note and actions taken to provide care for this client.  E. Tam Savoia, CNM  

## 2022-01-07 ENCOUNTER — Encounter: Payer: Self-pay | Admitting: Family Medicine

## 2022-01-07 NOTE — Progress Notes (Unsigned)
Patient ID: Natalie Jenkins, female   DOB: [birth year], 21 y.o.   MRN: [id number] [facility] Department Maternity Care Conference  Maternity Care Conference Date: 01/07/22  [name] was identified by clinical staff to benefit from an interdisciplinary team approach to help improve pregnancy care.  The [facility] Maternity Care Conference includes the maternity clinic coordinator (RN), medical providers (MD/APP staff), Care Management -OBCM and Healthy Beginnings, Centering Pregnancy coordinator, Infant Mortality reduction Dietitian.  Nursing staff are also encouraged to participate. The group meets monthly to discuss patient care and coordinate services.   The patient's care care at the agency was reviewed in EMR and high risk factors evaluated in an interdisciplinary approach.    Value added interventions discussed at this care conference today were:    [facility] Meeting 12/30/21 Patient has history of Gallbladder stones  Surgery has been set for 8/9 L.[name]

## 2022-01-12 ENCOUNTER — Ambulatory Visit: Payer: Medicaid Other | Admitting: Advanced Practice Midwife

## 2022-01-12 VITALS — BP 106/67 | HR 77 | Temp 97.0°F | Wt 233.8 lb

## 2022-01-12 DIAGNOSIS — O99210 Obesity complicating pregnancy, unspecified trimester: Secondary | ICD-10-CM

## 2022-01-12 DIAGNOSIS — O0993 Supervision of high risk pregnancy, unspecified, third trimester: Secondary | ICD-10-CM

## 2022-01-12 DIAGNOSIS — O0991 Supervision of high risk pregnancy, unspecified, first trimester: Secondary | ICD-10-CM

## 2022-01-12 DIAGNOSIS — O99213 Obesity complicating pregnancy, third trimester: Secondary | ICD-10-CM

## 2022-01-12 DIAGNOSIS — D509 Iron deficiency anemia, unspecified: Secondary | ICD-10-CM

## 2022-01-12 LAB — URINALYSIS
Bilirubin, UA: NEGATIVE
Glucose, UA: NEGATIVE
Ketones, UA: NEGATIVE
Nitrite, UA: NEGATIVE
Protein,UA: NEGATIVE
RBC, UA: NEGATIVE
Specific Gravity, UA: 1.015 (ref 1.005–1.030)
Urobilinogen, Ur: 0.2 mg/dL (ref 0.2–1.0)
pH, UA: 6.5 (ref 5.0–7.5)

## 2022-01-12 LAB — WET PREP FOR TRICH, YEAST, CLUE
Trichomonas Exam: NEGATIVE
Yeast Exam: NEGATIVE

## 2022-01-12 NOTE — Progress Notes (Signed)
36 2/7 GA, 36 week packet given and reviewed. Roddie Mc utilized as [facility] Teacher, English as a foreign language. Patient does not wish to self collect vaginal samples. C/o pain with walking. In House Lab reviewed at visit. Delynn Flavin RN

## 2022-01-12 NOTE — Progress Notes (Addendum)
[facility] Department Maternal Health Clinic  PRENATAL VISIT NOTE  Subjective:  Natalie Jenkins is a 21 y.o. G3P1011 at [redacted]w[redacted]d being seen today for ongoing prenatal care.  She is currently monitored for the following issues for this high-risk pregnancy and has Hx depression; Obesity affecting pregnancy BMI=36.6; history macrosomic infant 9#5; Supervision of high risk pregnancy in first trimester; Abnormal laboratory test TSH elevated and free T4 wnl, T3 low on 07/26/21;  subclinical hypothyroidism 07/26/21; Gallstones; and Iron deficiency anemia on their problem list.  Patient reports no complaints.  Contractions: Not present.  .  Movement: Present. Denies leaking of fluid/ROM.   The following portions of the patient's history were reviewed and updated as appropriate: allergies, current medications, past family history, past medical history, past social history, past surgical history and problem list. Problem list updated.  Objective:   Vitals:   01/12/22 0939  BP: 106/67  Pulse: 77  Temp: (!) 97 F (36.1 C)  Weight: 233 lb 12.8 oz (106.1 kg)    Fetal Status: Fetal Heart Rate (bpm): 130 Fundal Height: 37 cm Movement: Present  Presentation: Vertex  General:  Alert, oriented and cooperative. Patient is in no acute distress.  Skin: Skin is warm and dry. No rash noted.   Cardiovascular: Normal heart rate noted  Respiratory: Normal respiratory effort, no problems with respiration noted  Abdomen: Soft, gravid, appropriate for gestational age.  Pain/Pressure: Absent     Pelvic: Cervical exam deferred        Extremities: Normal range of motion.  Edema: None  Mental Status: Normal mood and affect. Normal behavior. Normal judgment and thought content.   Assessment and Plan:  Pregnancy: G3P1011 at [redacted]w[redacted]d  1. Obesity affecting pregnancy, antepartum 13 lb 12.8 oz (6.26 kg) 3 lb wt gain in past 2 wks Taking ASA 81 mg daily  - Chlamydia/GC NAA, Confirmation - GBS  Culture  2. Iron deficiency anemia, unspecified iron deficiency anemia type Taking FeSo4 I daily with oj Hgb=10.9 on 12/28/21  3. Supervision of high risk pregnancy in first trimester GC/Chlamydia/GBS cultures done Wet mount done and C&S for c/o labial pressure and pain going down left inner thigh--normal discomforts of pregnancy and suggestions given Not working Not exercising Has car seat, diapers, crib and ready for baby at home Has 01/26/22 general surgery consult for gallstones  - WET PREP FOR TRICH, YEAST, CLUE - Urinalysis (Urine Dip) - Urine Culture & Sensitivity   Preterm labor symptoms and general obstetric precautions including but not limited to vaginal bleeding, contractions, leaking of fluid and fetal movement were reviewed in detail with the patient. Please refer to After Visit Summary for other counseling recommendations.  Return in about 1 week (around 01/19/2022) for routine PNC.  No future appointments.  Alberteen Spindle, CNM

## 2022-01-14 LAB — URINE CULTURE: Organism ID, Bacteria: NO GROWTH

## 2022-01-16 LAB — CHLAMYDIA/GC NAA, CONFIRMATION
Chlamydia trachomatis, NAA: NEGATIVE
Neisseria gonorrhoeae, NAA: NEGATIVE

## 2022-01-16 LAB — CULTURE, BETA STREP (GROUP B ONLY): Strep Gp B Culture: NEGATIVE

## 2022-01-18 ENCOUNTER — Ambulatory Visit: Payer: Medicaid Other | Admitting: Advanced Practice Midwife

## 2022-01-18 VITALS — BP 96/62 | HR 101 | Temp 97.3°F | Wt 234.5 lb

## 2022-01-18 DIAGNOSIS — D509 Iron deficiency anemia, unspecified: Secondary | ICD-10-CM

## 2022-01-18 DIAGNOSIS — O99210 Obesity complicating pregnancy, unspecified trimester: Secondary | ICD-10-CM

## 2022-01-18 DIAGNOSIS — K8020 Calculus of gallbladder without cholecystitis without obstruction: Secondary | ICD-10-CM

## 2022-01-18 DIAGNOSIS — O0991 Supervision of high risk pregnancy, unspecified, first trimester: Secondary | ICD-10-CM

## 2022-01-18 NOTE — Progress Notes (Signed)
[facility] Department Maternal Health Clinic  PRENATAL VISIT NOTE  Subjective:  Natalie Jenkins is a 21 y.o. G3P1011 at [redacted]w[redacted]d being seen today for ongoing prenatal care.  She is currently monitored for the following issues for this high-risk pregnancy and has Hx depression; Obesity affecting pregnancy BMI=36.6; history macrosomic infant 9#5; Supervision of high risk pregnancy in first trimester; Abnormal laboratory test TSH elevated and free T4 wnl, T3 low on 07/26/21;  subclinical hypothyroidism 07/26/21; Gallstones; and Iron deficiency anemia on their problem list.  Patient reports no complaints.  Contractions: Not present. Vag. Bleeding: None.  Movement: Present. Denies leaking of fluid/ROM.   The following portions of the patient's history were reviewed and updated as appropriate: allergies, current medications, past family history, past medical history, past social history, past surgical history and problem list. Problem list updated.  Objective:   Vitals:   01/18/22 1348  BP: 96/62  Pulse: (!) 101  Temp: (!) 97.3 F (36.3 C)  Weight: 234 lb 8 oz (106.4 kg)    Fetal Status: Fetal Heart Rate (bpm): 160 Fundal Height: 39 cm Movement: Present  Presentation: Vertex  General:  Alert, oriented and cooperative. Patient is in no acute distress.  Skin: Skin is warm and dry. No rash noted.   Cardiovascular: Normal heart rate noted  Respiratory: Normal respiratory effort, no problems with respiration noted  Abdomen: Soft, gravid, appropriate for gestational age.  Pain/Pressure: Absent     Pelvic: Cervical exam deferred        Extremities: Normal range of motion.  Edema: None  Mental Status: Normal mood and affect. Normal behavior. Normal judgment and thought content.   Assessment and Plan:  Pregnancy: G3P1011 at [redacted]w[redacted]d  1. Iron deficiency anemia, unspecified iron deficiency anemia type Taking FeSo4 I daily with oj Hgb=10.9 on 12/28/21  2. Obesity affecting pregnancy,  antepartum 14 lb 8 oz (6.577 kg) Walking 3x/wk x 1 hour   3. Supervision of high risk pregnancy in first trimester Not working Knows when to go to L&D Has car seat, crib, and ready for baby at home GBS/GC/Chlamydia neg 01/12/22 as well as C&S Here with 2 yo son   4. Gallstones Has [facility] general surgery consult 01/26/22 9:40   Preterm labor symptoms and general obstetric precautions including but not limited to vaginal bleeding, contractions, leaking of fluid and fetal movement were reviewed in detail with the patient. Please refer to After Visit Summary for other counseling recommendations.  Return in about 1 week (around 01/25/2022) for routine PNC.  No future appointments.  Alberteen Spindle, CNM

## 2022-01-24 ENCOUNTER — Ambulatory Visit: Admit: 2022-01-24 | Discharge: 2022-01-24 | Payer: PRIVATE HEALTH INSURANCE

## 2022-01-25 ENCOUNTER — Ambulatory Visit: Payer: Medicaid Other | Admitting: Nurse Practitioner

## 2022-01-25 ENCOUNTER — Encounter: Payer: Self-pay | Admitting: Nurse Practitioner

## 2022-01-25 VITALS — BP 102/65 | HR 91 | Temp 97.3°F | Wt 235.2 lb

## 2022-01-25 DIAGNOSIS — O99210 Obesity complicating pregnancy, unspecified trimester: Secondary | ICD-10-CM

## 2022-01-25 DIAGNOSIS — O0993 Supervision of high risk pregnancy, unspecified, third trimester: Secondary | ICD-10-CM

## 2022-01-25 DIAGNOSIS — K8020 Calculus of gallbladder without cholecystitis without obstruction: Secondary | ICD-10-CM

## 2022-01-25 DIAGNOSIS — O99013 Anemia complicating pregnancy, third trimester: Secondary | ICD-10-CM

## 2022-01-25 DIAGNOSIS — F32A Depression, unspecified: Secondary | ICD-10-CM

## 2022-01-25 LAB — HEMOGLOBIN, FINGERSTICK: Hemoglobin: 10.7 g/dL — ABNORMAL LOW (ref 11.1–15.9)

## 2022-01-25 NOTE — Progress Notes (Signed)
Patient here for MH RV at 38 1/7. Patient states she went to the ED on Sunday due to a fall and felt the baby wasn't moving as much as normal.  Patient states she was having some contractions but they sent her home. Patient states baby moves a lot a night. Aware of [facility] Surgery consult in [facility] tomorrow. Hgb check today.Burt Knack, RN

## 2022-01-25 NOTE — Progress Notes (Signed)
[facility] Department Maternal Health Clinic  PRENATAL VISIT NOTE  Subjective:  Natalie Jenkins is a 21 y.o. G3P1011 at [redacted]w[redacted]d being seen today for ongoing prenatal care.  She is currently monitored for the following issues for this high-risk pregnancy and has Hx depression; Obesity affecting pregnancy BMI=36.6; history macrosomic infant 9#5; Supervision of high risk pregnancy in first trimester; Abnormal laboratory test TSH elevated and free T4 wnl, T3 low on 07/26/21;  subclinical hypothyroidism 07/26/21; Gallstones; and Iron deficiency anemia on their problem list.  Patient reports  pelvic pain .  Contractions: Irregular. Vag. Bleeding: None.  Movement: Present. Denies leaking of fluid/ROM.   The following portions of the patient's history were reviewed and updated as appropriate: allergies, current medications, past family history, past medical history, past social history, past surgical history and problem list. Problem list updated.  Objective:   Vitals:   01/25/22 1334  BP: 102/65  Pulse: 91  Temp: (!) 97.3 F (36.3 C)  Weight: 235 lb 3.2 oz (106.7 kg)    Fetal Status: Fetal Heart Rate (bpm): 135 Fundal Height: 38 cm Movement: Present  Presentation: Vertex  General:  Alert, oriented and cooperative. Patient is in no acute distress.  Skin: Skin is warm and dry. No rash noted.   Cardiovascular: Normal heart rate noted  Respiratory: Normal respiratory effort, no problems with respiration noted  Abdomen: Soft, gravid, appropriate for gestational age.  Pain/Pressure: Present     Pelvic: Cervical exam deferred        Extremities: Normal range of motion.  Edema: None  Mental Status: Normal mood and affect. Normal behavior. Normal judgment and thought content.   Assessment and Plan:  Pregnancy: G3P1011 at [redacted]w[redacted]d  1. Supervision of high risk pregnancy in third trimester -21 year old female in clinic today for prenatal care. -Patient taking PNV daily. -Patient with  complaints of pelvic pain.  Advised patient to continue with stretching exercises and walking. -Contractions irregular, reviewed with patient of when to report to the hospital.    2. Anemia affecting pregnancy in third trimester -Patient reports taking Iron daily.  Also advised to continue with Iron rich foods. -hgb today = 10.7 - Hemoglobin, fingerstick  3. Depression, unspecified depression type -Denies signs and symptoms of depression.  4. Obesity affecting pregnancy, antepartum -Encouraged patient to continue with walking.  Limit foods high in fat, carbs, and sugar. -15 lb 3.2 oz (6.895 kg)   5. Gallstones - [facility] general surgery appointment scheduled for 01/26/22.   Term labor symptoms and general obstetric precautions including but not limited to vaginal bleeding, contractions, leaking of fluid and fetal movement were reviewed in detail with the patient. Please refer to After Visit Summary for other counseling recommendations.   Due to a language barrier an interpreter (language line [phone]) was used for the provider portion of the visit.     Return in about 1 week (around 02/01/2022) for Routine prenatal care visit.  Future Appointments  Date Time Provider Department Center  02/01/2022  2:00 PM [facility] PROVIDER [facility] None    Glenna Fellows, FNP

## 2022-01-26 ENCOUNTER — Ambulatory Visit: Admit: 2022-01-26 | Discharge: 2022-01-27 | Payer: PRIVATE HEALTH INSURANCE | Attending: Surgery | Primary: Surgery

## 2022-01-28 ENCOUNTER — Telehealth: Payer: Self-pay | Admitting: Family Medicine

## 2022-01-28 NOTE — Telephone Encounter (Signed)
Return call to patient regarding her S/Sx.  Patient reports having contractions that started yesterday at 2 pm.  They contractions per her app on her phone are 7 minutes and 22 seconds apart  and last 48 seconds.  I inquired about vaginal discharge or bleeding and she reports more wetness and about an hour to an hour and half ago she lost this corkscrew thing, which I explained to the patient was probably her "mucous plug". I asked her if there was someone there with her now who could drive her to the ED because she seems to be in labor and she reports no, but she can call her partner and he will come pick her up and drive her.  I explained to her the importance of going sooner rather than later.  She verbalizes understanding.  Language Line used today.  Irving Burton / [id number].  Hart Carwin, RN

## 2022-01-28 NOTE — Telephone Encounter (Signed)
Patient is experiencing contractions and doesn't know if to go to the hospital or wait.

## 2022-02-01 ENCOUNTER — Ambulatory Visit: Payer: Medicaid Other | Admitting: Advanced Practice Midwife

## 2022-02-01 VITALS — BP 98/58 | HR 87 | Temp 97.4°F | Wt 237.0 lb

## 2022-02-01 DIAGNOSIS — O0991 Supervision of high risk pregnancy, unspecified, first trimester: Secondary | ICD-10-CM

## 2022-02-01 DIAGNOSIS — O0993 Supervision of high risk pregnancy, unspecified, third trimester: Secondary | ICD-10-CM | POA: Diagnosis not present

## 2022-02-01 DIAGNOSIS — O99013 Anemia complicating pregnancy, third trimester: Secondary | ICD-10-CM

## 2022-02-01 DIAGNOSIS — K8020 Calculus of gallbladder without cholecystitis without obstruction: Secondary | ICD-10-CM

## 2022-02-01 DIAGNOSIS — O99213 Obesity complicating pregnancy, third trimester: Secondary | ICD-10-CM

## 2022-02-01 DIAGNOSIS — O99210 Obesity complicating pregnancy, unspecified trimester: Secondary | ICD-10-CM

## 2022-02-01 LAB — HEMOGLOBIN, FINGERSTICK: Hemoglobin: 10.8 g/dL — ABNORMAL LOW (ref 11.1–15.9)

## 2022-02-01 NOTE — Progress Notes (Signed)
IOL referral faxed with confirmation.   Patrice from [facility] IOL scheduling called and LVM regarding IOL referral.    Earlyne Iba, RN

## 2022-02-01 NOTE — Progress Notes (Signed)
Hgb reviewed during clinic visit - pt to continue taking iron daily.   Earlyne Iba, RN

## 2022-02-01 NOTE — Progress Notes (Signed)
[facility] Department Maternal Health Clinic  PRENATAL VISIT NOTE  Subjective:  Natalie Jenkins is a 21 y.o. G3P1011 at [redacted]w[redacted]d being seen today for ongoing prenatal care.  She is currently monitored for the following issues for this high-risk pregnancy and has Hx depression; Obesity affecting pregnancy BMI=36.6; history macrosomic infant 9#5; Supervision of high risk pregnancy in first trimester; Abnormal laboratory test TSH elevated and free T4 wnl, T3 low on 07/26/21;  subclinical hypothyroidism 07/26/21; Gallstones; and Iron deficiency anemia on their problem list.  Patient reports contractions since 01/28/22 .  Contractions: Not present. Vag. Bleeding: None.  Movement: Present. Denies leaking of fluid/ROM.   The following portions of the patient's history were reviewed and updated as appropriate: allergies, current medications, past family history, past medical history, past social history, past surgical history and problem list. Problem list updated.  Objective:  There were no vitals filed for this visit.  Fetal Status: Fetal Heart Rate (bpm): 135 Fundal Height: 38 cm Movement: Present  Presentation: Vertex  General:  Alert, oriented and cooperative. Patient is in no acute distress.  Skin: Skin is warm and dry. No rash noted.   Cardiovascular: Normal heart rate noted  Respiratory: Normal respiratory effort, no problems with respiration noted  Abdomen: Soft, gravid, appropriate for gestational age.  Pain/Pressure: Absent     Pelvic: Cervical exam performed Dilation: 3 Effacement (%): Thick, 50 Station: Ballotable  Extremities: Normal range of motion.  Edema: None  Mental Status: Normal mood and affect. Normal behavior. Normal judgment and thought content.   Assessment and Plan:  Pregnancy: G3P1011 at [redacted]w[redacted]d  1. Anemia affecting pregnancy in third trimester Taking FeSo4 5x/wk with lemonaide - Hemoglobin, fingerstick  2. Obesity affecting pregnancy, antepartum 15 lb  3.2 oz (6.895 kg)   3. Supervision of high risk pregnancy in first trimester Pt desires IOL to be  02/14/22--paperwork completed Lost mucous plug on 01/31/22 with irregular u/c's x 3 days Has car seat and ready for baby at home Knows when to go to L&D Pt agrees to membrane sweeping--done easily   4. Gallstones Kept 01/26/22 general surgery consult and to have gallbladder removed 04/22/22   Term labor symptoms and general obstetric precautions including but not limited to vaginal bleeding, contractions, leaking of fluid and fetal movement were reviewed in detail with the patient. Please refer to After Visit Summary for other counseling recommendations.  Return in about 1 week (around 02/08/2022) for routine PNC.  No future appointments.  Alberteen Spindle, CNM

## 2022-02-02 ENCOUNTER — Encounter
Admit: 2022-02-02 | Discharge: 2022-02-04 | Disposition: A | Discharge status: Home / Self Care | Payer: PRIVATE HEALTH INSURANCE

## 2022-02-02 ENCOUNTER — Encounter
Admit: 2022-02-02 | Discharge: 2022-02-04 | Disposition: A | Discharge status: Home / Self Care | Payer: PRIVATE HEALTH INSURANCE | Attending: Student in an Organized Health Care Education/Training Program

## 2022-02-02 ENCOUNTER — Ambulatory Visit
Admit: 2022-02-02 | Discharge: 2022-02-04 | Disposition: A | Discharge status: Home / Self Care | Payer: PRIVATE HEALTH INSURANCE

## 2022-02-03 MED ORDER — IBUPROFEN 600 MG TABLET
ORAL_TABLET | Freq: Four times a day (QID) | ORAL | 0 refills | 8 days | Status: CP
Start: 2022-02-03 — End: ?

## 2022-02-08 ENCOUNTER — Ambulatory Visit: Payer: Medicaid Other

## 2022-03-16 ENCOUNTER — Ambulatory Visit: Payer: Medicaid Other

## 2022-03-18 ENCOUNTER — Encounter: Payer: Self-pay | Admitting: Advanced Practice Midwife

## 2022-03-18 ENCOUNTER — Ambulatory Visit: Payer: Medicaid Other | Admitting: Advanced Practice Midwife

## 2022-03-18 VITALS — BP 123/78 | Wt 214.6 lb

## 2022-03-18 DIAGNOSIS — Z30013 Encounter for initial prescription of injectable contraceptive: Secondary | ICD-10-CM

## 2022-03-18 DIAGNOSIS — Z3009 Encounter for other general counseling and advice on contraception: Secondary | ICD-10-CM | POA: Diagnosis not present

## 2022-03-18 LAB — HM HIV SCREENING LAB: HM HIV Screening: NEGATIVE

## 2022-03-18 MED ORDER — MEDROXYPROGESTERONE ACETATE 150 MG/ML IM SUSP
150.0000 mg | Freq: Once | INTRAMUSCULAR | Status: AC
  Administered 2022-03-18: 150 mg via INTRAMUSCULAR

## 2022-03-18 NOTE — Progress Notes (Signed)
Here for PP today. Desires Nexplanon for [facility], would like Depo today and schedule return visit for Nexplanon. Her partner and their 23 year old are in the waiting area. Vaginal delivery on 02/01/22. Marland KitchenJenetta Downer, RN

## 2022-03-18 NOTE — Progress Notes (Addendum)
Hgb reviewed, no treatment indicated. Depo given, tolerated well, next Depo card given. Nexplanon consult done today and patient states she will call for Nexplanon insertion appointment. Condoms given and patient counseled to use condoms with all sex for next 2 weeks.. Interpreters used [employer] [place] [employer] #[id number] .Marland KitchenJenetta Downer, RN

## 2022-03-18 NOTE — Progress Notes (Signed)
[facility] Department  Postpartum Exam  Natalie Jenkins is a 21 y.o. SHF exvaper [id number] (2 yrs 6 mo) female who presents for a postpartum visit. She is 6 weeks postpartum following a normal spontaneous vaginal delivery on 02/01/22 at 39 2/7 wks with epidural M 7#8 over second degree laceration. Breastfeeding q 1-2 hours and baby weighed 12#3 on 03/15/22 with peds. Partner is helping her with kids. Denies pp coitus or lochia. Last pap 10/14/19. neg.  I have fully reviewed the prenatal and intrapartum course. The delivery was at 76 2/7 gestational weeks.  Anesthesia: epidural. Postpartum course has been wnl. Baby is doing well. Baby is feeding by breast. Bleeding no bleeding. Bowel function is  constipated . Bladder function is normal. Patient is not sexually active. Contraception method is Depo-Provera injections. Postpartum depression screening: negative.  Wants DMPA today and will return for Nexplanon insertion later. Denies cigs, cigars, MJ. Last vaped 2 years ago. Last ETOH 1.5 yrs ago (2 beer).     The pregnancy intention screening data noted above was reviewed. Potential methods of contraception were discussed. The patient elected to proceed with No data recorded.   Edinburgh Postnatal Depression Scale - 03/18/22 1655       Edinburgh Postnatal Depression Scale:  In the Past 7 Days   I have been able to laugh and see the funny side of things. 0    I have looked forward with enjoyment to things. 0    I have blamed myself unnecessarily when things went wrong. 0    I have been anxious or worried for no good reason. 0    I have felt scared or panicky for no good reason. 0    Things have been getting on top of me. 0    I have been so unhappy that I have had difficulty sleeping. 0    I have felt sad or miserable. 0    I have been so unhappy that I have been crying. 0    The thought of harming myself has occurred to me. 0    Edinburgh Postnatal Depression Scale Total 0              Health Maintenance Due  Topic Date Due   HPV VACCINES (1 - 2-dose series) Never done   COVID-19 Vaccine (3 - Moderna series) 01/15/2021   INFLUENZA VACCINE  01/18/2022    The following portions of the patient's history were reviewed and updated as appropriate: allergies, current medications, past family history, past medical history, past social history, past surgical history, and problem list.  Review of Systems Pertinent items are noted in HPI.  Objective:  BP 123/78   Wt 214 lb 9.6 oz (97.3 kg)   LMP  (LMP Unknown) Comment: no period since pregnancy  Breastfeeding Yes   BMI 35.71 kg/m    General:  alert   Breasts:  normal  Lungs: clear to auscultation bilaterally  Heart:  regular rate and rhythm, S1, S2 normal, no murmur, click, rub or gallop  Abdomen: soft, non-tender; bowel sounds normal; no masses,  no organomegaly   Wound N/a  GU exam:  normal       Assessment:    1. Family planning Pt desires DMPA today  and will return for Nexplanon insertion  2. Postpartum exam    6 wk postpartum exam.   Plan:   Essential components of care per ACOG recommendations:  1.  Mood and well being: Patient with negative depression screening  today. Reviewed local resources for support.  - Patient tobacco use? No.   - hx of drug use? No.    2. Infant care and feeding:  -Patient currently breastmilk feeding? Yes. Reviewed importance of draining breast regularly to support lactation.  -Social determinants of health (SDOH) reviewed in EPIC. No concernsThe following needs were identified none  3. Sexuality, contraception and birth spacing - Patient does not want a pregnancy in the next year.  Desired family size is 2 children.  - Reviewed reproductive life planning. Reviewed options based on patient desire and reproductive life plan. Patient is interested in Hormonal Injection. This was provided to the patient today.  if not why not clearly documented  Risks, benefits,  and typical effectiveness rates were reviewed.  Questions were answered.  Written information was also given to the patient to review.    The patient will follow up in  11 weeks for surveillance.  The patient was told to call with any further questions, or with any concerns about this method of contraception.  Emphasized use of condoms 100% of the time for STI prevention.  Patient was offered ECP based on not meeting criteria.  Patient is within 0 days of unprotected sex. Patient was offered ECP. Reviewed options and patient desired No method of ECP, declined all    - Discussed birth spacing of 18 months  4. Sleep and fatigue -Encouraged family/partner/community support of 4 hrs of uninterrupted sleep to help with mood and fatigue  5. Physical Recovery  - Discussed patients delivery and complications. She describes her labor as mixed. - Patient had a Vaginal, no problems at delivery. Patient had a 2nd degree laceration. Perineal healing reviewed. Patient expressed understanding - Patient has urinary incontinence? No. - Patient is not safe to resume physical and sexual activity  6.  Health Maintenance - HM due items addressed Yes - Last pap smear No results found for: "DIAGPAP" Pap smear not done at today's visit.  -Breast Cancer screening indicated? No.   7. Chronic Disease/Pregnancy Condition follow up: None  - PCP follow up  Herbie Saxon, CNM

## 2022-03-21 LAB — HEMOGLOBIN, FINGERSTICK: Hemoglobin: 11.7 g/dL (ref 11.1–15.9)

## 2022-04-12 ENCOUNTER — Telehealth: Payer: Self-pay | Admitting: Family Medicine

## 2022-04-12 NOTE — Telephone Encounter (Signed)
Patient been bleeding for 2 weeks now and is concerned if that's normal with depo

## 2022-04-15 ENCOUNTER — Ambulatory Visit
Admit: 2022-04-15 | Discharge: 2022-04-18 | Disposition: A | Discharge status: Home / Self Care | Payer: PRIVATE HEALTH INSURANCE | Admitting: Student in an Organized Health Care Education/Training Program

## 2022-04-15 ENCOUNTER — Encounter
Admit: 2022-04-15 | Discharge: 2022-04-18 | Disposition: A | Discharge status: Home / Self Care | Payer: PRIVATE HEALTH INSURANCE | Attending: Anesthesiology | Admitting: Student in an Organized Health Care Education/Training Program

## 2022-04-18 MED ORDER — ONDANSETRON 4 MG DISINTEGRATING TABLET
ORAL_TABLET | Freq: Three times a day (TID) | ORAL | 0 refills | 4 days | Status: CP | PRN
Start: 2022-04-18 — End: 2022-04-25
  Filled 2022-04-18: qty 10, 4d supply, fill #0

## 2022-04-18 MED ORDER — OXYCODONE 5 MG TABLET
ORAL_TABLET | ORAL | 0 refills | 2 days | Status: CP | PRN
Start: 2022-04-18 — End: ?
  Filled 2022-04-18: qty 10, 2d supply, fill #0

## 2022-04-18 MED ORDER — FERROUS SULFATE 325 MG (65 MG IRON) TABLET
ORAL_TABLET | ORAL | 0 refills | 60 days | Status: CP
Start: 2022-04-18 — End: ?
  Filled 2022-04-18: qty 30, 60d supply, fill #0

## 2022-05-03 ENCOUNTER — Ambulatory Visit
Admit: 2022-05-03 | Discharge: 2022-05-04 | Payer: PRIVATE HEALTH INSURANCE | Attending: Acute Care | Primary: Acute Care

## 2022-05-03 DIAGNOSIS — Z9049 Acquired absence of other specified parts of digestive tract: Principal | ICD-10-CM

## 2022-05-19 ENCOUNTER — Ambulatory Visit: Admit: 2022-05-19 | Discharge: 2022-05-20 | Payer: PRIVATE HEALTH INSURANCE

## 2022-05-19 DIAGNOSIS — M059 Rheumatoid arthritis with rheumatoid factor, unspecified: Principal | ICD-10-CM

## 2022-05-19 DIAGNOSIS — M2550 Pain in unspecified joint: Principal | ICD-10-CM

## 2022-05-19 DIAGNOSIS — S36119S Unspecified injury of liver, sequela: Principal | ICD-10-CM

## 2022-05-19 DIAGNOSIS — Z9049 Acquired absence of other specified parts of digestive tract: Principal | ICD-10-CM

## 2022-05-19 DIAGNOSIS — Z789 Other specified health status: Principal | ICD-10-CM

## 2022-05-19 DIAGNOSIS — M069 Rheumatoid arthritis, unspecified: Principal | ICD-10-CM

## 2022-05-19 DIAGNOSIS — D509 Iron deficiency anemia, unspecified: Principal | ICD-10-CM

## 2022-05-19 DIAGNOSIS — E038 Other specified hypothyroidism: Principal | ICD-10-CM

## 2022-05-19 DIAGNOSIS — M791 Myalgia, unspecified site: Principal | ICD-10-CM

## 2022-05-19 MED ORDER — DICLOFENAC 1 % TOPICAL GEL
Freq: Four times a day (QID) | TOPICAL | 5 refills | 57 days | Status: CP | PRN
Start: 2022-05-19 — End: 2023-05-19

## 2022-05-21 DIAGNOSIS — M0579 Rheumatoid arthritis with rheumatoid factor of multiple sites without organ or systems involvement: Principal | ICD-10-CM

## 2022-05-21 MED ORDER — PREDNISONE 10 MG TABLET
ORAL_TABLET | 1 refills | 0 days | Status: CP
Start: 2022-05-21 — End: ?

## 2022-05-21 MED ORDER — FAMOTIDINE 20 MG TABLET
ORAL_TABLET | Freq: Two times a day (BID) | ORAL | 5 refills | 30 days | Status: CP | PRN
Start: 2022-05-21 — End: 2022-11-17

## 2022-06-03 ENCOUNTER — Ambulatory Visit (LOCAL_COMMUNITY_HEALTH_CENTER): Payer: Medicaid Other

## 2022-06-03 ENCOUNTER — Ambulatory Visit: Payer: Medicaid Other

## 2022-06-03 VITALS — BP 121/68 | Ht 65.0 in | Wt 210.0 lb

## 2022-06-03 DIAGNOSIS — Z309 Encounter for contraceptive management, unspecified: Secondary | ICD-10-CM

## 2022-06-03 DIAGNOSIS — Z3042 Encounter for surveillance of injectable contraceptive: Secondary | ICD-10-CM

## 2022-06-03 DIAGNOSIS — Z3009 Encounter for other general counseling and advice on contraception: Secondary | ICD-10-CM

## 2022-06-03 MED ORDER — MEDROXYPROGESTERONE ACETATE 150 MG/ML IM SUSP
150.0000 mg | INTRAMUSCULAR | Status: AC
  Administered 2022-06-03: 150 mg via INTRAMUSCULAR

## 2022-06-03 NOTE — Progress Notes (Signed)
11 weeks 0 days post depo. Wants to continue depo. Voices no concerns.   Pt says she had recent dx of rheumatoid arthritis and has regular f-u with [facility] provider.   Pt had pp visit 03/18/2022 and had depo as a one time order.   Consult Aliene Altes, FNP who orders depo every 3 months for 4 doses.   Depo given today and tolerated well L delt. Next depo due 08/19/2022, has reminder. Judie Petit [place] interpreter. Jerel Shepherd, RN

## 2022-06-10 ENCOUNTER — Ambulatory Visit
Admit: 2022-06-10 | Discharge: 2022-06-11 | Payer: PRIVATE HEALTH INSURANCE | Attending: Student in an Organized Health Care Education/Training Program | Primary: Student in an Organized Health Care Education/Training Program

## 2022-06-10 DIAGNOSIS — M069 Rheumatoid arthritis, unspecified: Principal | ICD-10-CM

## 2022-06-10 MED ORDER — HYDROXYCHLOROQUINE 200 MG TABLET
ORAL_TABLET | Freq: Two times a day (BID) | ORAL | 3 refills | 90 days | Status: CP
Start: 2022-06-10 — End: 2023-06-10

## 2022-06-10 MED ORDER — PREDNISONE 10 MG TABLET
ORAL_TABLET | 1 refills | 0 days | Status: CP
Start: 2022-06-10 — End: ?

## 2022-07-22 ENCOUNTER — Ambulatory Visit
Admit: 2022-07-22 | Discharge: 2022-07-23 | Payer: PRIVATE HEALTH INSURANCE | Attending: Student in an Organized Health Care Education/Training Program | Primary: Student in an Organized Health Care Education/Training Program

## 2022-07-22 DIAGNOSIS — M0579 Rheumatoid arthritis with rheumatoid factor of multiple sites without organ or systems involvement: Principal | ICD-10-CM

## 2022-07-22 DIAGNOSIS — M069 Rheumatoid arthritis, unspecified: Principal | ICD-10-CM

## 2022-07-22 MED ORDER — PREDNISONE 10 MG TABLET
ORAL_TABLET | 1 refills | 0 days | Status: CP
Start: 2022-07-22 — End: ?

## 2022-07-22 MED ORDER — CERTOLIZUMAB PEGOL 400 MG/2 ML (200 MG/ML X2) SUBCUTANEOUS SYRINGE KIT
SUBCUTANEOUS | 0 refills | 42.00000 days | Status: CP
Start: 2022-07-22 — End: 2022-08-20

## 2022-07-27 DIAGNOSIS — M0579 Rheumatoid arthritis with rheumatoid factor of multiple sites without organ or systems involvement: Principal | ICD-10-CM

## 2022-08-19 IMAGING — CR DG CHEST 2V
1 series · 2 of 2 positions shown · non-contrast
Comparison: None.

CLINICAL DATA: Occasionally productive cough with green sputum and
sore throat x 4 days. Pt's baby was diagnosed with bronchitis, and
now she is exhibiting symptoms. Pt's voice is hoarse at this time.

EXAM:
CHEST - 2 VIEW

[Series 1: dg chest 2 view · 0.14mm/px · 2 of 2 slices shown]
[im 1/2]
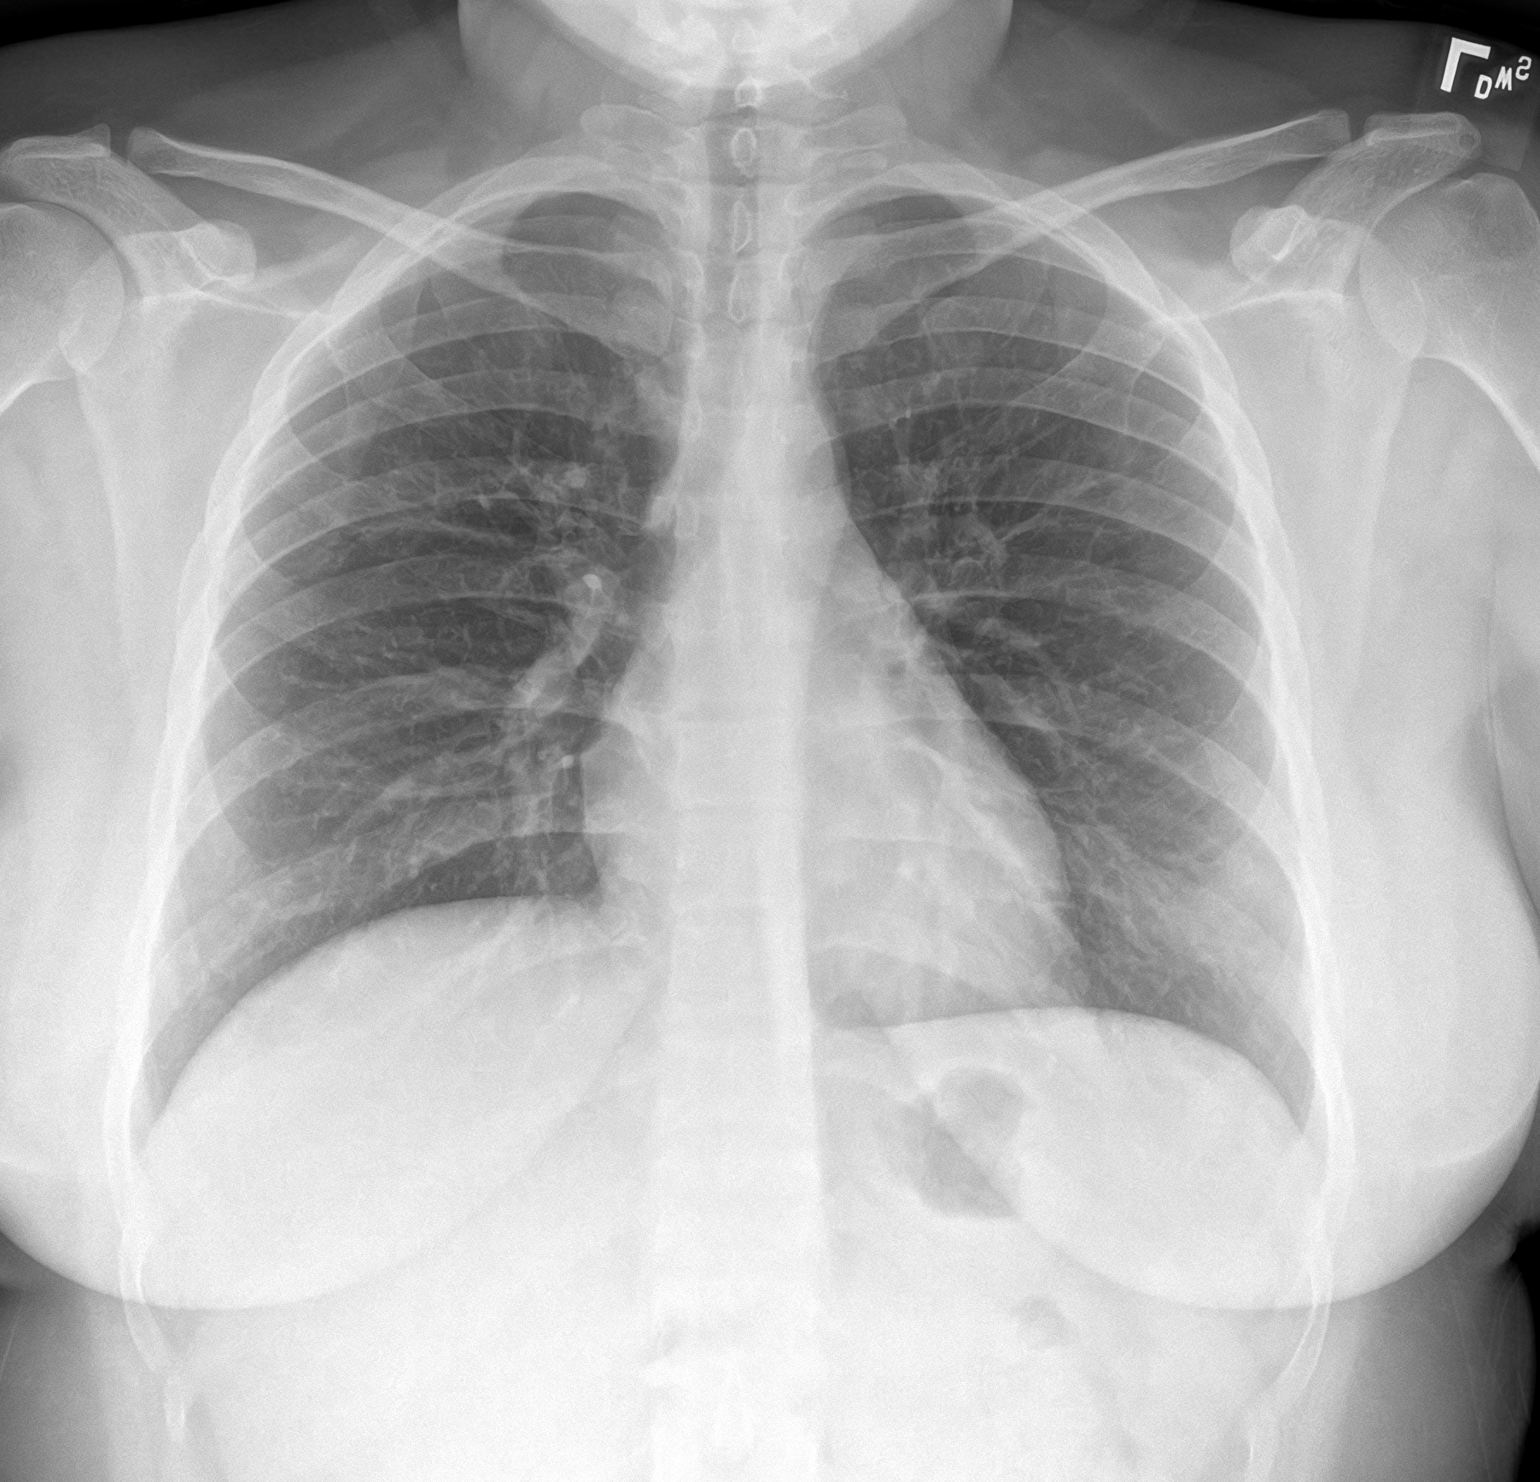
[im 2/2]
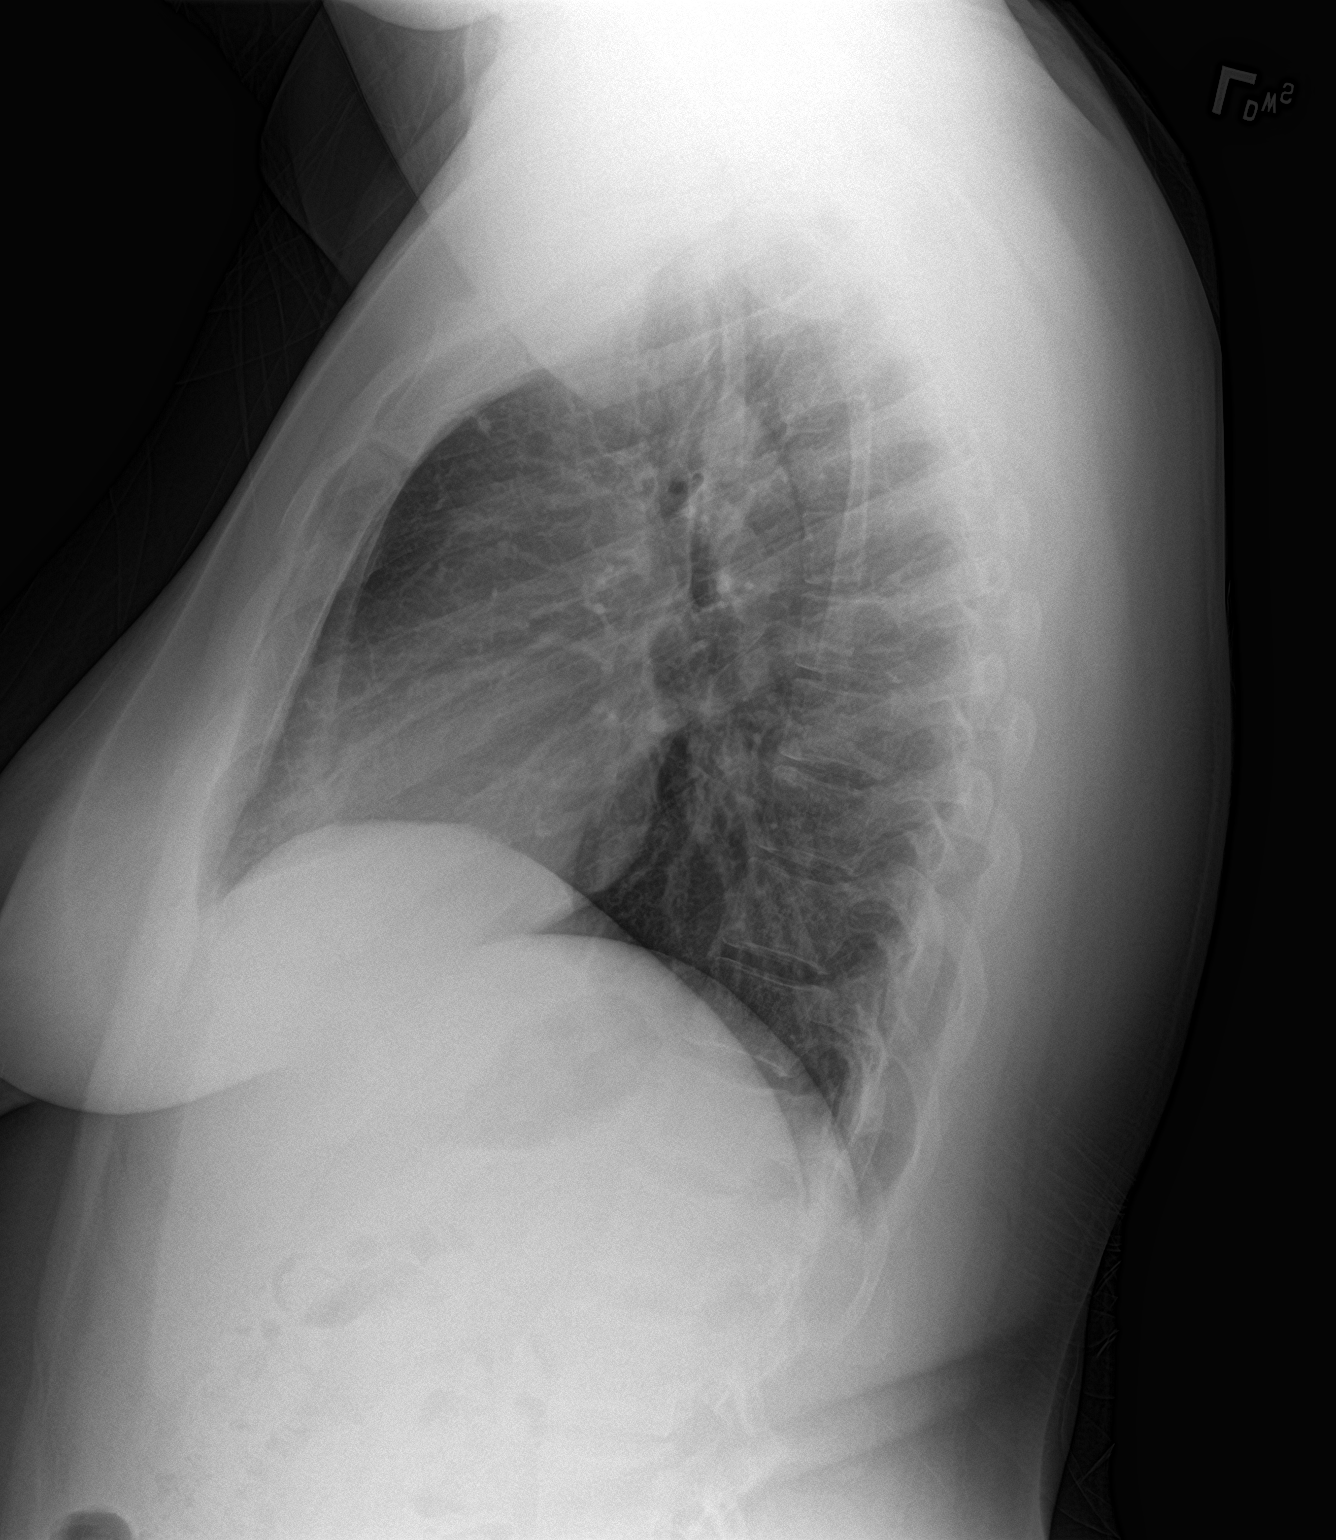

[2 of 2 positions shown; findings below may reference images not displayed]

FINDINGS: Normal heart, mediastinum and hila.

Clear lungs.  No pleural effusion or pneumothorax.

Skeletal structures are within normal limits.
IMPRESSION: Normal chest radiographs.

## 2022-08-26 ENCOUNTER — Ambulatory Visit: Admit: 2022-08-26 | Discharge: 2022-08-27 | Payer: PRIVATE HEALTH INSURANCE

## 2022-08-26 DIAGNOSIS — Z3009 Encounter for other general counseling and advice on contraception: Principal | ICD-10-CM

## 2022-08-26 DIAGNOSIS — M0579 Rheumatoid arthritis with rheumatoid factor of multiple sites without organ or systems involvement: Principal | ICD-10-CM

## 2022-08-26 MED ORDER — NORGESTIMATE-ETHINYL ESTRADIOL 0.18 MG/0.215MG/0.25MG-35 MCG(28)TABLET
ORAL_TABLET | Freq: Every day | ORAL | 3 refills | 84 days | Status: CP
Start: 2022-08-26 — End: 2023-08-26

## 2022-09-12 DIAGNOSIS — M069 Rheumatoid arthritis, unspecified: Principal | ICD-10-CM

## 2022-09-15 MED ORDER — PREDNISONE 10 MG TABLET
ORAL_TABLET | 1 refills | 0 days | Status: CP
Start: 2022-09-15 — End: ?

## 2022-10-10 ENCOUNTER — Ambulatory Visit
Admit: 2022-10-10 | Discharge: 2022-10-11 | Payer: PRIVATE HEALTH INSURANCE | Attending: Student in an Organized Health Care Education/Training Program | Primary: Student in an Organized Health Care Education/Training Program

## 2022-10-10 DIAGNOSIS — M0579 Rheumatoid arthritis with rheumatoid factor of multiple sites without organ or systems involvement: Principal | ICD-10-CM

## 2022-10-10 MED ORDER — ADALIMUMAB PEN CITRATE FREE 40 MG/0.4 ML
SUBCUTANEOUS | 3 refills | 84 days | Status: CP
Start: 2022-10-10 — End: 2023-10-10
  Filled 2022-10-17: qty 2, 28d supply, fill #0

## 2022-10-10 NOTE — Unmapped (Signed)
RHEUMATOLOGY CLINIC FOLLOW-UP NOTE      Assessment/Plan:      Marie Cole is a 22 y.o. female with a history of iron deficiency anemia, subclinical hypothyroidism, GERD, and currently breast feeding who presents for follow up of seropositive rheumatoid arthritis.  She failed treatment with hydroxychloroquine due to side effects of facial rash and chest discomfort. She also ran out of prednisone and has multiple active joints off prednisone consistent with Moderate severity rheumatoid arthritis with a CDAI of 30 today. She is currently breast feeding so options are limited to TNF inhibitors and sulfasalazine. In shared decision making, patient also preferred to start a TNFi due to safety in breastfeeding and less frequency of lab draws than sulfasalazine.  Her insurance company denied Cimzia with preference of Enbrel or Humira.  Both of these medications are considered safe with breast-feeding although there is more data with Cimzia.     Seropositive Rheumatoid arthritis:   - Start Humira 40 mg every other week  - refilled prednisone 10 mg daily until Humira available. Continue prednisone 10 mg daily until on Humira for two weeks, then decrease to 5 mg daily for 2 weeks, then stop prednisone.    - I recommend beginning therapy with a TNF inhibitor. I discussed the risks of use to include injection site reaction, allergic reaction like anaphylaxis, and serious infection. I also discussed the risk of malignancy and demyelinating disease. Pt is to seek medical attention for any signs/symptoms of infection so that this may be properly treated. Pt verbalizes understanding and wishes to proceed with therapy.   - infectious screening including hepatitis B, hepatitis C, and quantiferon gold plus negative  - repeat monitoring labs in 3 months after starting Humira    Positive ANA:  We discussed that a positive antinuclear antibody (ANA) test can be found in up to 20% of healthy individuals. In the absence of clinical manifestations, the test itself is not worrisome or indicative of an underlying autoimmune disease. Based on her history and exam, I have a very low suspicion for an autoimmune disease such as systemic lupus erythematosus (SLE)   - Does not need to have an ANA repeated.   - We reviewed that she should contact us if she were to develop any of the following symptoms: Alopecia, oral or nasal ulcers, photosensitivity, rash on her face, arms, or chest, skin tightening, Raynaud's, dry eyes or mouth, joint pain or swelling, pleuritic chest pain, or seizures.       Health Maintenance  - PCV-20: Up to date   - Annual flu vaccination: Up to date   - COVID Vaccination: Due, patient declined vaccination   - Contraception: not taking teratogenic medication      Return in about 4 months (around 02/09/2023).    This patient was seen and discussed with Dr. Marland Mcalpine who agrees with the plan outlined above.    Nathen May, DO  Rheumatology Fellow, PGY-4    Subjective:   Primary Care Provider: Aaron Mose, MD    HPI:  Marie Cole is a 22 y.o.  female with a PMH of iron deficiency anemia, subclinical hypothyroidism, GERD, and currently breast feeding presenting for follow-up of seropositive rheumatoid arthritis.      Last seen in clinic 07/22/2022.  Start hydroxychloroquine due to side effects and ordered Cimzia every 14 days.  Medication was denied by her insurance with preference of Enbrel or Humira.  Attempted to contact patient multiple times to discuss alternative options but  she was unable to be contacted.     Today patient reports that today her pain is okay but the pain comes and goes. She is taking prednisone 10 mg daily. If the pain is bad she takes 2 tablets.  She has confirmed her phone number and set up her voicemail so she will be able to receive phone calls from our office.  She endorses weight gain and lower extremity swelling while taking prednisone is interested in stopping the medication. Disease History:  Started having joint pain during a  hospitalization 04/15/22 for acute liver injury from cholelithiasis sp cholecystectomy on 10/29. At the time attributed to possible reactive viral arthritis that she was positive for rhinovirus and enterovirus.  But pain persisted and she was having trouble performing ADLs and taking care of her new baby.  Her pain involves multiple sites including hands, feet, knees and shoulders.  Found to have positive RF 397.3 and positive anti-CCP antibody > 340 also ANA +1: 80. Established care with [facility] 06/11/23. Started plaquenil since safe in breastfeeding.     Prior treatments:  - hydroxychloroquine -- discontinued due to patient reported fatigue chest pressure, and redness in her face    PMH, PFH, PSH as previously documented. Changes addressed in HPI    Medications were reviewed this visit.  Current Outpatient Medications on File Prior to Visit   Medication Sig    diclofenac sodium (VOLTAREN) 1 % gel Apply 4 g topically four (4) times a day as needed for arthritis or pain.    famotidine (PEPCID) 20 MG tablet Take 1 tablet (20 mg total) by mouth two (2) times a day as needed for heartburn. (Patient not taking: Reported on 07/22/2022)    multivitamin, prenatal, folic acid-iron, 27-1 mg Tab Take 1 tablet by mouth daily.    norgestimate-ethinyl estradiol (ORTHO TRI-CYCLEN, 28,) 0.18/0.215/0.25 mg-35 mcg (28) per tablet Take 1 tablet by mouth daily.    predniSONE (DELTASONE) 10 MG tablet Take 1 tablet (10 mg total) by mouth daily. If rheumatoid arthritis pain is not relieved, may increase to 2 tablets (20 mg total) by mouth daily. Tome 1 tableta (10 mg en total) por via oral al dia. Si no se alivia el dolor de la artritis reumatoide, se puede aumentar a 2 comprimidos (20 mg en total) por via oral al dia.     No current facility-administered medications on file prior to visit.       Immunization were reviewed this visit.  Immunization History   Administered Date(s) Administered    COVID-19 VACCINE,MRNA(MODERNA)(PF) 09/18/2020, 11/20/2020    INFLUENZA VACCINE QUAD (IIV4 PF) 5MO+ INJECTABLE  06/06/2019, 08/23/2021    Influenza Vaccine Quad(IM)6 MO-Adult(PF) 06/06/2019, 08/23/2021    Influenza Virus Vaccine, unspecified formulation 06/17/2019, 08/23/2021    MMR 08/31/2019, 10/14/2019    Pneumococcal Conjugate 20-valent 07/22/2022    TdaP 06/06/2019, 11/16/2021       Objective:     Vitals:    10/10/22 1600   BP: 100/76   Pulse: 79   Temp: 36.3 ??C (97.3 ??F)   Weight: (!) 108.4 kg (239 lb)   Height: 165.1 cm (5' 5)       Body mass index is 39.77 kg/m??.    Physical Exam  Gen: no acute distress.  Well nourished and well kempt.  Head: No alopecia. [place]/AT.  Eyes: EOMI. PERRL. No conjunctival injection. No scleral icterus. Adequate tear meniscus.   Ears: Normal external exam. Normal TM's.  Nose/Oral:  No eyrthema in the nares  or nasal polyps.  Clear oropharynx. No oral ulcers. Adequate sublingual salivary pooling.   Neck:  Supple, no JVD.  No thyromegaly.  No carotid bruit.   Lymph: No lymphadenopathy of cervical, supraclavicular, axillary or inguinal areas.  Chest: No increased work of breathing. CTAB.  Cardiovascular: RRR, normal s1/s2, no murmurs, rubs, or gallops appreciated.  2+ distal pulses equal bilaterally.  GI:  Soft, NT, ND.  MSK:     Hands: Non-tender. No swelling/synovitis. Full ROM. Able to make fist.    Wrists: Non-tender. No swelling/synovitis. Full ROM.    Elbows: Non-tender. No swelling/synovitis. Full ROM.    Shoulders: Non-tender. Full ROM.    Neck: Non-tender to mid-line palpation. Full ROM.    Back: Non-tender to mid-line palpation & SI joints. No deformities. Full ROM.    Hips: Non-tender. Full ROM.    Knees: Non-tender. No swelling/synovitis. Full ROM.    Ankles: Non-tender. No swelling/synovitis. Full ROM.    Feet: Non-tender. No swelling/synovitis. Full ROM.   Neuro:  AAO x 3.  Able to move all extremities.   Skin:  No rashes, nodules or dry skin noted. No petichiae or purpura.  No cyanosis or clubbing.  Psych: Normal mood and affect. Normal thought process.      Test Results  No visits with results within 4 Week(s) from this visit.   Latest known visit with results is:   Office Visit on 06/10/2022   Component Date Value    Hep B S Ab 06/10/2022 Nonreactive     Hep B Surf Ab Quant 06/10/2022 <8.00     Hep B Surface Ag 06/10/2022 Nonreactive     Hep B Core Total Ab 06/10/2022 Nonreactive     Hepatitis C Ab 06/10/2022 Nonreactive       Latest Reference Range & Units 05/19/22 10:54   Quantiferon TB Gold Plus Interpretation Negative  Negative   Quantiferon TB NIL value IU/mL 0.05   Quantiferon Antigen 1 minus Nil IU/mL 0.04   Quantiferon Antigen 2 minus NIL IU/mL 0.04   Quantiferon Mitogen IU/mL 9.95

## 2022-10-11 DIAGNOSIS — M0579 Rheumatoid arthritis with rheumatoid factor of multiple sites without organ or systems involvement: Principal | ICD-10-CM

## 2022-10-12 NOTE — Unmapped (Signed)
[facility] Specialty Medication Onboarding    Specialty Medication: HUMIRA(CF) PEN 40 mg/0.4 mL injection (adalimumab)  Prior Authorization: Approved   Financial Assistance: No - copay  <$25  Final Copay/Day Supply: $0 / 28 days    Insurance Restrictions: Yes - max 1 month supply     Notes to Pharmacist: n/a    The triage team has completed the benefits investigation and has determined that the patient is able to fill this medication at [facility]. Please contact the patient to complete the onboarding or follow up with the prescribing physician as needed.

## 2022-10-13 MED ORDER — EMPTY CONTAINER
2 refills | 0 days
Start: 2022-10-13 — End: ?

## 2022-10-13 NOTE — Unmapped (Signed)
[facility]   Patient Onboarding/Medication Counseling    Her insurance company denied Cimzia with preference of Enbrel or Humira.  Both of these medications are considered safe with breast-feeding although there is more data with Cimzia.     Marie Cole is a 22 y.o. female with rheumatoid arthritis who I am counseling today on initiation of therapy.  I am speaking to the patient.    Was a Nurse, learning disability used for this call? Yes, Spanish. Patient language is appropriate in [facility]     Verified patient's date of birth / HIPAA.    Specialty medication(s) to be sent: Inflammatory Disorders: Humira      Non-specialty medications/supplies to be sent: sharps      Medications not needed at this time: N/A       The patient declined counseling on missed dose instructions, goals of therapy, warnings and precautions, drug/food interactions, and storage, handling precautions, and disposal because they were counseled in clinic. The information in the declined sections below are for informational purposes only and was not discussed with patient.       Humira (adalimumab)    Medication & Administration     Dosage: Rheumatoid arthritis: Inject 40mg  under the skin every 14 days    Lab tests required prior to treatment initiation:  Tuberculosis: Tuberculosis screening resulted in a non-reactive Quantiferon TB Gold assay. 05/19/22  Hepatitis B: Hepatitis B serology studies are complete and non-reactive. 06/10/22    Administration:     Prefilled auto-injector pen  1. Gather all supplies needed for injection on a clean, flat working surface: medication pen removed from packaging, alcohol swab, sharps container, etc.  2. Look at the medication label - look for correct medication, correct dose, and check the expiration date  3. Look at the medication - the liquid visible in the window on the side of the pen device should appear clear and colorless  4. Lay the auto-injector pen on a flat surface and allow it to warm up to room temperature for at least 30-45 minutes  5. Select injection site - you can use the front of your thigh or your belly (but not the area 2 inches around your belly button); if someone else is giving you the injection you can also use your upper arm in the skin covering your triceps muscle  6. Prepare injection site - wash your hands and clean the skin at the injection site with an alcohol swab and let it air dry, do not touch the injection site again before the injection  7. Pull the 2 safety caps straight off - gray/white to uncover the needle cover and the plum cap to uncover the plum activator button, do not remove until immediately prior to injection and do not touch the white needle cover  8. Gently squeeze the area of cleaned skin and hold it firmly to create a firm surface at the selected injection site  9. Put the white needle cover against your skin at the injection site at a 90 degree angle, hold the pen such that you can see the clear medication window  10. Press down and hold the pen firmly against your skin, press the plum activator button to initiate the injection, there will be a click when the injection starts  11. Continue to hold the pen firmly against your skin for about 10-15 seconds - the window will start to turn solid yellow  12. To verify the injection is complete after 10-15 seconds, look  and ensure the window is solid yellow and then pull the pen away from your skin  13. Dispose of the used auto-injector pen immediately in your sharps disposal container the needle will be covered automatically  14. If you see any blood at the injection site, press a cotton ball or gauze on the site and maintain pressure until the bleeding stops, do not rub the injection site    Adherence/Missed dose instructions:  If your injection is given more than 3 days after your scheduled injection date - consult your pharmacist for additional instructions on how to adjust your dosing schedule.    Goals of Therapy     - Achieve symptom remission  - Slow disease progression  - Protection of remaining articular structures  - Maintenance of function  - Maintenance of effective psychosocial functioning    Side Effects & Monitoring Parameters     Injection site reaction (redness, irritation, inflammation localized to the site of administration)  Signs of a common cold - minor sore throat, runny or stuffy nose, etc.  Upset stomach  Headache    The following side effects should be reported to the provider:  Signs of a hypersensitivity reaction - rash; hives; itching; red, swollen, blistered, or peeling skin; wheezing; tightness in the chest or throat; difficulty breathing, swallowing, or talking; swelling of the mouth, face, lips, tongue, or throat; etc.  Reduced immune function - report signs of infection such as fever; chills; body aches; very bad sore throat; ear or sinus pain; cough; more sputum or change in color of sputum; pain with passing urine; wound that will not heal, etc.  Also at a slightly higher risk of some malignancies (mainly skin and blood cancers) due to this reduced immune function.  In the case of signs of infection - the patient should hold the next dose of Humira?? and call your primary care provider to ensure adequate medical care.  Treatment may be resumed when infection is treated and patient is asymptomatic.  Changes in skin - a new growth or lump that forms; changes in shape, size, or color of a previous mole or marking  Signs of unexplained bruising or bleeding - throwing up blood or emesis that looks like coffee grounds; black, tarry, or bloody stool; etc.  Signs of new or worsening heart failure - shortness of breath; sudden weight gain; heartbeat that is not normal; swelling in the arms or legs that is new or worse      Contraindications, Warnings, & Precautions     Have your bloodwork checked as you have been told by your prescriber  Talk with your doctor if you are pregnant, planning to become pregnant, or breastfeeding  Discuss the possible need for holding your dose(s) of Humira?? when a planned procedure is scheduled with the prescriber as it may delay healing/recovery timeline       Drug/Food Interactions     Medication list reviewed in Epic. The patient was instructed to inform the care team before taking any new medications or supplements. No drug interactions identified.   Talk with you prescriber or pharmacist before receiving any live vaccinations while taking this medication and after you stop taking it    Storage, Handling Precautions, & Disposal     Store this medication in the refrigerator.  Do not freeze  If needed, you may store at room temperature for up to 14 days  Store in original packaging, protected from light  Do not shake  Dispose of used syringes/pens  in a sharps disposal container         Current Medications (including OTC/herbals), Comorbidities and Allergies     Current Outpatient Medications   Medication Sig Dispense Refill    ADALIMUMAB PEN CITRATE FREE 40 MG/0.4 ML Inject the contents of 1 pen (40 mg total) under the skin every fourteen (14) days. 6 each 3    diclofenac sodium (VOLTAREN) 1 % gel Apply 4 g topically four (4) times a day as needed for arthritis or pain. 900 g 5    empty container Misc Use to dispose your Humira medication 1 each 2    famotidine (PEPCID) 20 MG tablet Take 1 tablet (20 mg total) by mouth two (2) times a day as needed for heartburn. (Patient not taking: Reported on 07/22/2022) 60 tablet 5    multivitamin, prenatal, folic acid-iron, 27-1 mg Tab Take 1 tablet by mouth daily.      norgestimate-ethinyl estradiol (ORTHO TRI-CYCLEN, 28,) 0.18/0.215/0.25 mg-35 mcg (28) per tablet Take 1 tablet by mouth daily. 84 tablet 3    predniSONE (DELTASONE) 10 MG tablet Take 1 tablet (10 mg total) by mouth daily. If rheumatoid arthritis pain is not relieved, may increase to 2 tablets (20 mg total) by mouth daily. Tome 1 tableta (10 mg en total) por via oral al dia. Si no se alivia el dolor de la artritis reumatoide, se puede aumentar a 2 comprimidos (20 mg en total) por via oral al dia. 60 tablet 1     No current facility-administered medications for this visit.       No Known Allergies    Patient Active Problem List   Diagnosis    Iron deficiency anemia    Subclinical hypothyroidism    History of difficult pregnancy    History of cholecystectomy    Liver injury, sequela    Breastfeeding (infant)    Joint pain    Rheumatoid arthritis involving multiple sites with positive rheumatoid factor (CMS-HCC)    Gastroesophageal reflux disease without esophagitis       Reviewed and up to date in Epic.    Appropriateness of Therapy     Acute infections noted within Epic:  No active infections  Patient reported infection: None    Is medication and dose appropriate based on diagnosis and infection status? Yes    Prescription has been clinically reviewed: Yes      Baseline Quality of Life Assessment      How many days over the past month did your RA  keep you from your normal activities? For example, brushing your teeth or getting up in the morning. 0    Financial Information     Medication Assistance provided: Prior Authorization    Anticipated copay of $0/28 days reviewed with patient. Verified delivery address.    Delivery Information     Scheduled delivery date: 10/17/22    Expected start date: 10/17/22      Medication will be delivered via Same Day Courier to the prescription address in [facility].  This shipment will not require a signature.      Explained the services we provide at [facility] and that each month we would call to set up refills.  Stressed importance of returning phone calls so that we could ensure they receive their medications in time each month.  Informed patient that we should be setting up refills 7-10 days prior to when they will run out of medication.  A pharmacist will reach out  to perform a clinical assessment periodically.  Informed patient that a welcome packet, containing information about our pharmacy and other support services, a Notice of Privacy Practices, and a drug information handout will be sent.      The patient or caregiver noted above participated in the development of this care plan and knows that they can request review of or adjustments to the care plan at any time.      Patient or caregiver verbalized understanding of the above information as well as how to contact the pharmacy at [phone] option 4 with any questions/concerns.  The pharmacy is open Monday through Friday 8:30am-4:30pm.  A pharmacist is available 24/7 via pager to answer any clinical questions they may have.    Patient Specific Needs     Does the patient have any physical, cognitive, or cultural barriers? No    Does the patient have adequate living arrangements? (i.e. the ability to store and take their medication appropriately) Yes    Did you identify any home environmental safety or security hazards? No    Patient prefers to have medications discussed with  Patient     Is the patient or caregiver able to read and understand education materials at a high school level or above? Yes    Patient's primary language is  Spanish     Is the patient high risk? No    SOCIAL DETERMINANTS OF HEALTH     At the [facility], we have learned that life circumstances - like trouble affording food, housing, utilities, or transportation can affect the health of many of our patients.   That is why we wanted to ask: are you currently experiencing any life circumstances that are negatively impacting your health and/or quality of life? Patient declined to answer    Social Determinants of Health     Financial Resource Strain: Low Risk  (05/19/2022)    Overall Financial Resource Strain (CARDIA)     Difficulty of Paying Living Expenses: Not hard at all   Internet Connectivity: Not on file   Food Insecurity: No Food Insecurity (05/19/2022)    Hunger Vital Sign     Worried About Running Out of Food in the Last Year: Never true     Ran Out of Food in the Last Year: Never true   Tobacco Use: Low Risk  (10/10/2022)    Patient History     Smoking Tobacco Use: Never     Smokeless Tobacco Use: Never     Passive Exposure: Never   Housing/Utilities: Low Risk  (05/19/2022)    Housing/Utilities     Within the past 12 months, have you ever stayed: outside, in a car, in a tent, in an overnight shelter, or temporarily in someone else's home (i.e. couch-surfing)?: No     Are you worried about losing your housing?: No     Within the past 12 months, have you been unable to get utilities (heat, electricity) when it was really needed?: No   Alcohol Use: Not on file   Transportation Needs: No Transportation Needs (05/19/2022)    PRAPARE - Transportation     Lack of Transportation (Medical): No     Lack of Transportation (Non-Medical): No   Substance Use: Not on file   Health Literacy: Not on file   Physical Activity: Not on file   Interpersonal Safety: Not on file   Stress: No Stress Concern Present (02/12/2019)    Received from [facility] of  Occupational Health - Occupational Stress Questionnaire     Feeling of Stress : Only a little   Intimate Partner Violence: Not At Risk (03/18/2022)    Received from [facility]    Humiliation, Afraid, Rape, and Kick questionnaire     Fear of Current or Ex-Partner: No     Emotionally Abused: No     Physically Abused: No     Sexually Abused: No   Depression: Not on file   Social Connections: Not on file       Would you be willing to receive help with any of the needs that you have identified today? Not applicable       Sherral Hammers, PharmD  [facility] Specialty Pharmacist

## 2022-10-17 MED FILL — EMPTY CONTAINER: 120 days supply | Qty: 1 | Fill #0

## 2022-11-15 NOTE — Unmapped (Signed)
[facility] Clinical Assessment & Refill Coordination Note    Marie Cole, [place]: 02/22/2001  Phone: [phone] (home)     All above HIPAA information was verified with patient.     Was a Nurse, learning disability used for this call? Yes, spanish. Patient language is appropriate in [facility]    Specialty Medication(s):   Inflammatory Disorders: Humira     Current Outpatient Medications   Medication Sig Dispense Refill    ADALIMUMAB PEN CITRATE FREE 40 MG/0.4 ML Inject the contents of 1 pen (40 mg total) under the skin every fourteen (14) days. 6 each 3    diclofenac sodium (VOLTAREN) 1 % gel Apply 4 g topically four (4) times a day as needed for arthritis or pain. 900 g 5    empty container Misc Use to dispose your Humira medication 1 each 2    famotidine (PEPCID) 20 MG tablet Take 1 tablet (20 mg total) by mouth two (2) times a day as needed for heartburn. (Patient not taking: Reported on 07/22/2022) 60 tablet 5    multivitamin, prenatal, folic acid-iron, 27-1 mg Tab Take 1 tablet by mouth daily.      norgestimate-ethinyl estradiol (ORTHO TRI-CYCLEN, 28,) 0.18/0.215/0.25 mg-35 mcg (28) per tablet Take 1 tablet by mouth daily. 84 tablet 3    predniSONE (DELTASONE) 10 MG tablet Take 1 tablet (10 mg total) by mouth daily. If rheumatoid arthritis pain is not relieved, may increase to 2 tablets (20 mg total) by mouth daily. Tome 1 tableta (10 mg en total) por via oral al dia. Si no se alivia el dolor de la artritis reumatoide, se puede aumentar a 2 comprimidos (20 mg en total) por via oral al dia. 60 tablet 1     No current facility-administered medications for this visit.        Changes to medications: Desare reports no changes at this time.    No Known Allergies    Changes to allergies: No    SPECIALTY MEDICATION ADHERENCE       Medication Adherence    Patient reported X missed doses in the last month: 0  Specialty Medication: Humira  Patient is on additional specialty medications: No  Informant: patient          Specialty medication(s) dose(s) confirmed: Regimen is correct and unchanged.     Are there any concerns with adherence? No    Adherence counseling provided? Not needed    CLINICAL MANAGEMENT AND INTERVENTION      Clinical Benefit Assessment:    Do you feel the medicine is effective or helping your condition? Yes    Clinical Benefit counseling provided? Reasonable expectations discussed: Can take up to 12-16 weeks to see full benefits, patient is still feeling a lot better. Has some pain in the foot    Adverse Effects Assessment:    Are you experiencing any side effects? Yes, patient reports experiencing headache. Side effect counseling provided: for 2 days but is now on and off.   Tylenol recovers.      Are you experiencing difficulty administering your medicine? No    Quality of Life Assessment:    Quality of Life    Rheumatology  Oncology  Dermatology  Cystic Fibrosis          How many days over the past month did your RA  keep you from your normal activities? For example, brushing your teeth or getting up in the morning. Patient declined to answer  Have you discussed this with your provider? Not needed    Acute Infection Status:    Acute infections noted within Epic:  No active infections  Patient reported infection: None    Therapy Appropriateness:    Is therapy appropriate and patient progressing towards therapeutic goals? Yes, therapy is appropriate and should be continued    DISEASE/MEDICATION-SPECIFIC INFORMATION      For patients on injectable medications: Patient currently has 0 doses left.  Next injection is scheduled for 5/27 but will take on 5/30.    Chronic Inflammatory Diseases: Have you experienced any flares in the last month? No    PATIENT SPECIFIC NEEDS     Does the patient have any physical, cognitive, or cultural barriers? No    Is the patient high risk? No    Did the patient require a clinical intervention? No    Does the patient require physician intervention or other additional services (i.e., nutrition, smoking cessation, social work)? No    SOCIAL DETERMINANTS OF HEALTH     At the [facility], we have learned that life circumstances - like trouble affording food, housing, utilities, or transportation can affect the health of many of our patients.   That is why we wanted to ask: are you currently experiencing any life circumstances that are negatively impacting your health and/or quality of life? Patient declined to answer    Social Determinants of Health     Financial Resource Strain: Low Risk  (05/19/2022)    Overall Financial Resource Strain (CARDIA)     Difficulty of Paying Living Expenses: Not hard at all   Internet Connectivity: Not on file   Food Insecurity: No Food Insecurity (05/19/2022)    Hunger Vital Sign     Worried About Running Out of Food in the Last Year: Never true     Ran Out of Food in the Last Year: Never true   Tobacco Use: Low Risk  (11/05/2022)    Patient History     Smoking Tobacco Use: Never     Smokeless Tobacco Use: Never     Passive Exposure: Never   Housing/Utilities: Low Risk  (05/19/2022)    Housing/Utilities     Within the past 12 months, have you ever stayed: outside, in a car, in a tent, in an overnight shelter, or temporarily in someone else's home (i.e. couch-surfing)?: No     Are you worried about losing your housing?: No     Within the past 12 months, have you been unable to get utilities (heat, electricity) when it was really needed?: No   Alcohol Use: Not on file   Transportation Needs: No Transportation Needs (05/19/2022)    PRAPARE - Transportation     Lack of Transportation (Medical): No     Lack of Transportation (Non-Medical): No   Substance Use: Not on file   Health Literacy: Not on file   Physical Activity: Not on file   Interpersonal Safety: Not on file   Stress: No Stress Concern Present (02/12/2019)    Received from [facility] of Occupational Health - Occupational Stress Questionnaire     Feeling of Stress : Only a little   Intimate Partner Violence: Not At Risk (03/18/2022)    Received from [facility]    Humiliation, Afraid, Rape, and Kick questionnaire     Fear of Current or Ex-Partner: No     Emotionally Abused: No     Physically Abused: No  Sexually Abused: No   Depression: Not on file   Social Connections: Not on file       Would you be willing to receive help with any of the needs that you have identified today? Not applicable       SHIPPING     Specialty Medication(s) to be Shipped:   Inflammatory Disorders: Humira    Other medication(s) to be shipped: No additional medications requested for fill at this time     Changes to insurance: No    Delivery Scheduled: Yes, Expected medication delivery date: 5/30.     Medication will be delivered via Same Day Courier to the confirmed prescription address in [facility].    The patient will receive a drug information handout for each medication shipped and additional FDA Medication Guides as required.  Verified that patient has previously received a Conservation officer, historic buildings and a Surveyor, mining.    The patient or caregiver noted above participated in the development of this care plan and knows that they can request review of or adjustments to the care plan at any time.      All of the patient's questions and concerns have been addressed.    Julianne Rice, PharmD   [facility] Specialty Pharmacist

## 2022-11-17 MED FILL — HUMIRA PEN CITRATE FREE 40 MG/0.4 ML: SUBCUTANEOUS | 28 days supply | Qty: 2 | Fill #1

## 2022-11-26 MED ORDER — NORGESTIMATE-ETHINYL ESTRADIOL 0.18 MG/0.215MG/0.25MG-35 MCG(28)TABLET
ORAL_TABLET | Freq: Every day | ORAL | 3 refills | 84 days
Start: 2022-11-26 — End: 2023-11-26

## 2022-11-29 MED ORDER — NORGESTIMATE-ETHINYL ESTRADIOL 0.18 MG/0.215MG/0.25MG-35 MCG(28)TABLET
ORAL_TABLET | Freq: Every day | ORAL | 3 refills | 84 days
Start: 2022-11-29 — End: 2023-11-29

## 2022-11-30 NOTE — Unmapped (Signed)
Interpreter Services Used:Juan;ID#[id number]    Complex Case Management  SUMMARY NOTE    Attempted to contact pt today at Home and Cell number to introduce Complex Case Management services. No answer, unable to leave message; 1st attempt    Discuss at next visit: Introduction to Complex Case Management      Deliah Goody - High Risk Care Coordinator   [facility]  [address]  [place], [place] [address]  P: [phone] F: [phone]  [email or web address]

## 2022-12-06 NOTE — Unmapped (Signed)
Interpreter Services Used:[place];ID#[id number]    Complex Case Management  SUMMARY NOTE    Attempted to contact pt today at Home and Cell number to introduce Complex Case Management services. Left message to return call.; 2nd attempt, letter sent.    Discuss at next visit: No answer, letter sent      Deliah Goody - High Risk Care Coordinator   [facility]  [address], [address]  [place], [place] [address]  P: [phone] F: [phone]  [email or web address]

## 2022-12-14 NOTE — Unmapped (Signed)
[facility] Clinical Intervention    Type of intervention: Side effect management    Medication involved: Humira    Problem identified: Bruising    Intervention performed: Spoke with patient (via interpreter) about the bruising side effect of Humira. Patient would like to continue therapy and will reach out to the provider if bruising worsens.    Follow-up needed: No    Approximate time spent: 15-20 minutes    Clinical evidence used to support intervention: Drug information resource    Result of the intervention: Improved medication adherence    Elnora Morrison, PharmD   [facility] Specialty Pharmacist

## 2022-12-14 NOTE — Unmapped (Signed)
[facility] Refill Coordination Note    Specialty Medication(s) to be Shipped:   Inflammatory Disorders: Humira    Other medication(s) to be shipped: No additional medications requested for fill at this time     Marie Cole, DOB: [birth year]  Phone: [phone] (home)       All above HIPAA information was verified with patient.     Was a Nurse, learning disability used for this call? Yes, Spanish. Patient language is appropriate in [facility]    Completed refill call assessment today to schedule patient's medication shipment from the [facility] [phone]).  All relevant notes have been reviewed.     Specialty medication(s) and dose(s) confirmed: Regimen is correct and unchanged.   Changes to medications: Marycruz reports no changes at this time.  Changes to insurance: No  New side effects reported not previously addressed with a pharmacist or physician: Yes - Patient reports bruising. Patient would like to speak to the pharmacist today. Their provider is not aware.  Questions for the pharmacist: No    Confirmed patient received a Conservation officer, historic buildings and a Surveyor, mining with first shipment. The patient will receive a drug information handout for each medication shipped and additional FDA Medication Guides as required.       DISEASE/MEDICATION-SPECIFIC INFORMATION        For patients on injectable medications: Patient currently has 0 doses left.  Next injection is scheduled for 12/16/2022.    SPECIALTY MEDICATION ADHERENCE              Were doses missed due to medication being on hold? No    Humira 40mg /0.55mL: 0 days of medicine on hand       REFERRAL TO PHARMACIST     Referral to the pharmacist: Yes - new side effects are reported: bruising. Refills were scheduled and concern routed (high priority) to pharmacist for evaluation.      SHIPPING     Shipping address confirmed in Epic.       Delivery Scheduled: Yes, Expected medication delivery date: 12/16/2022.     Medication will be delivered via Same Day Courier to the prescription address in Epic WAM.    Elnora Morrison, PharmD   [facility] Specialty Pharmacist

## 2022-12-16 MED FILL — HUMIRA PEN CITRATE FREE 40 MG/0.4 ML: SUBCUTANEOUS | 28 days supply | Qty: 2 | Fill #2

## 2023-01-02 ENCOUNTER — Ambulatory Visit
Admit: 2023-01-02 | Discharge: 2023-01-02 | Payer: PRIVATE HEALTH INSURANCE | Attending: Student in an Organized Health Care Education/Training Program | Primary: Student in an Organized Health Care Education/Training Program

## 2023-01-02 ENCOUNTER — Ambulatory Visit: Admit: 2023-01-02 | Discharge: 2023-01-02 | Payer: PRIVATE HEALTH INSURANCE

## 2023-01-02 DIAGNOSIS — R233 Spontaneous ecchymoses: Principal | ICD-10-CM

## 2023-01-02 DIAGNOSIS — M0579 Rheumatoid arthritis with rheumatoid factor of multiple sites without organ or systems involvement: Principal | ICD-10-CM

## 2023-01-02 DIAGNOSIS — Z79899 Other long term (current) drug therapy: Principal | ICD-10-CM

## 2023-01-02 LAB — CBC W/ AUTO DIFF
BASOPHILS ABSOLUTE COUNT: 0.1 10*9/L (ref 0.0–0.1)
BASOPHILS RELATIVE PERCENT: 0.7 %
EOSINOPHILS ABSOLUTE COUNT: 0.1 10*9/L (ref 0.0–0.5)
EOSINOPHILS RELATIVE PERCENT: 1.3 %
HEMATOCRIT: 37.7 % (ref 34.0–44.0)
HEMOGLOBIN: 12.3 g/dL (ref 11.3–14.9)
LYMPHOCYTES ABSOLUTE COUNT: 3.7 10*9/L — ABNORMAL HIGH (ref 1.1–3.6)
LYMPHOCYTES RELATIVE PERCENT: 36.7 %
MEAN CORPUSCULAR HEMOGLOBIN CONC: 32.6 g/dL (ref 32.0–36.0)
MEAN CORPUSCULAR HEMOGLOBIN: 28.1 pg (ref 25.9–32.4)
MEAN CORPUSCULAR VOLUME: 86.3 fL (ref 77.6–95.7)
MEAN PLATELET VOLUME: 8.5 fL (ref 6.8–10.7)
MONOCYTES ABSOLUTE COUNT: 0.7 10*9/L (ref 0.3–0.8)
MONOCYTES RELATIVE PERCENT: 6.7 %
NEUTROPHILS ABSOLUTE COUNT: 5.5 10*9/L (ref 1.8–7.8)
NEUTROPHILS RELATIVE PERCENT: 54.6 %
PLATELET COUNT: 311 10*9/L (ref 150–450)
RED BLOOD CELL COUNT: 4.37 10*12/L (ref 3.95–5.13)
RED CELL DISTRIBUTION WIDTH: 16.2 % — ABNORMAL HIGH (ref 12.2–15.2)
WBC ADJUSTED: 10.1 10*9/L (ref 3.6–11.2)

## 2023-01-02 LAB — CREATININE
CREATININE: 0.58 mg/dL
EGFR CKD-EPI (2021) FEMALE: >90 mL/min/{1.73_m2} (ref >=60)

## 2023-01-02 LAB — BUN: BLOOD UREA NITROGEN: 9 mg/dL (ref 9–23)

## 2023-01-02 LAB — ALBUMIN: ALBUMIN: 3.7 g/dL (ref 3.4–5.0)

## 2023-01-02 LAB — ALT: ALT (SGPT): 20 U/L (ref 10–49)

## 2023-01-02 LAB — PROTIME-INR
INR: 0.96
PROTIME: 10.8 s (ref 9.9–12.6)

## 2023-01-02 LAB — APTT
APTT: 33.7 s (ref 24.8–38.4)
HEPARIN CORRELATION: 0.2

## 2023-01-02 LAB — AST: AST (SGOT): 16 U/L (ref <=34)

## 2023-01-02 NOTE — Unmapped (Signed)
RHEUMATOLOGY CLINIC FOLLOW-UP NOTE      Assessment/Plan:      Marie Cole is a 22 y.o. female with a history of iron deficiency anemia, subclinical hypothyroidism, GERD, and currently breast feeding who presents for follow up of seropositive rheumatoid arthritis.     Easy Bruising   Small bruise on left breast and left shoulder. She also reports brusing on her thighs with humira injections. Also reports menorrhagia. This could be a side effect of prednisone use. Check labs to rule out coagulopathy or low platelets.    - cbc, inr, pt, and aptt      Seropositive Rheumatoid arthritis:   Joint pain and inflammation improved with initiation of humira.   - continue Humira 40 mg every other week   - discontinue prednisone   - repeat medication monitoring labs today.     Positive ANA:  We discussed that a positive antinuclear antibody (ANA) test can be found in up to 20% of healthy individuals. In the absence of clinical manifestations, the test itself is not worrisome or indicative of an underlying autoimmune disease. Based on her history and exam, I have a very low suspicion for an autoimmune disease such as systemic lupus erythematosus (SLE)   - Does not need to have an ANA repeated.   - We reviewed that she should contact us if she were to develop any of the following symptoms: Alopecia, oral or nasal ulcers, photosensitivity, rash on her face, arms, or chest, skin tightening, Raynaud's, dry eyes or mouth, joint pain or swelling, pleuritic chest pain, or seizures.       Health Maintenance  - PCV-20: Up to date   - Annual flu vaccination: Up to date   - COVID Vaccination: Due, patient declined vaccination   - Contraception: not taking teratogenic medication      Return in about 3 months (around 04/04/2023).    This patient was seen and discussed with Dr. Delton See who agrees with the plan outlined above.    Nathen May, DO  Rheumatology Fellow, PGY-5    Subjective:   Primary Care Provider: Aaron Mose, MD    HPI:  Marie Cole is a 22 y.o.  female with a PMH of iron deficiency anemia, subclinical hypothyroidism, GERD, and currently breast feeding presenting for follow-up of seropositive rheumatoid arthritis.      Last seen in clinic 10/10/22 started on humira. Since last visit patient reports she is having bruising. For one month. She started getting bruising on her chest and  arms that come and go.  It will occur after truama and also spontaneously. Deines other skin rashes. She denies bleeding gums. She has a menstural cycle every 28 and reports heavy bleeding. Using 5-6 tampons on heavy days. She does notice notice clots. This started two months ago.     Her joint pain and swelling is improved since starting Humira.  Denies trouble breathing or chest pain.  She feels fatigued. She has been using humira for 3 months.     Disease History:  Started having joint pain during a  hospitalization 04/15/22 for acute liver injury from cholelithiasis sp cholecystectomy on 10/29. At the time attributed to possible reactive viral arthritis that she was positive for rhinovirus and enterovirus.  But pain persisted and she was having trouble performing ADLs and taking care of her new baby.  Her pain involves multiple sites including hands, feet, knees and shoulders.  Found to have positive RF 397.3 and positive anti-CCP antibody >  340 also ANA +1: 80. Established care with [facility] 06/11/23. Started plaquenil since safe in breastfeeding. seen in clinic 10/10/22 started on humira since she was breast feeding.     Prior treatments:  - hydroxychloroquine -- discontinued due to patient reported fatigue chest pressure, and redness in her face  - humira started 10/10/2022    PMH, PFH, PSH as previously documented. Changes addressed in HPI    Medications were reviewed this visit.  Current Outpatient Medications on File Prior to Visit   Medication Sig    ADALIMUMAB PEN CITRATE FREE 40 MG/0.4 ML Inject the contents of 1 pen (40 mg total) under the skin every fourteen (14) days.    diclofenac sodium (VOLTAREN) 1 % gel Apply 4 g topically four (4) times a day as needed for arthritis or pain.    empty container Misc Use to dispose your Humira medication    famotidine (PEPCID) 20 MG tablet Take 1 tablet (20 mg total) by mouth two (2) times a day as needed for heartburn. (Patient not taking: Reported on 07/22/2022)    multivitamin, prenatal, folic acid-iron, 27-1 mg Tab Take 1 tablet by mouth daily.    norgestimate-ethinyl estradiol (ORTHO TRI-CYCLEN, 28,) 0.18/0.215/0.25 mg-35 mcg (28) per tablet Take 1 tablet by mouth daily.     No current facility-administered medications on file prior to visit.       Immunization were reviewed this visit.  Immunization History   Administered Date(s) Administered    COVID-19 VACCINE,MRNA(MODERNA)(PF) 09/18/2020, 11/20/2020    INFLUENZA VACCINE QUAD (IIV4 PF) 38MO+ INJECTABLE  06/06/2019, 08/23/2021    Influenza Vaccine Quad(IM)6 MO-Adult(PF) 06/06/2019, 08/23/2021    Influenza Virus Vaccine, unspecified formulation 06/17/2019, 08/23/2021    MMR 08/31/2019, 10/14/2019    Pneumococcal Conjugate 20-valent 07/22/2022    TdaP 06/06/2019, 11/16/2021       Objective:     Vitals:    01/02/23 1520   BP: 117/72   Pulse: 88   Temp: 36.9 ??C (98.4 ??F)   Weight: (!) 107 kg (236 lb)   Height: 165.1 cm (5' 5)       Body mass index is 39.27 kg/m??.    Physical Exam  Gen: no acute distress.  Well nourished and well kempt.  Head: No alopecia. [place]/AT.  Eyes: EOMI. PERRL. No conjunctival injection. No scleral icterus. Adequate tear meniscus.   Ears: Normal external exam. Normal TM's.  Nose/Oral:  No eyrthema in the nares or nasal polyps.  Clear oropharynx. No oral ulcers. Adequate sublingual salivary pooling.   Neck:  Supple, no JVD.  No thyromegaly.  No carotid bruit.   Lymph: No lymphadenopathy of cervical, supraclavicular, axillary or inguinal areas.  Chest: No increased work of breathing. CTAB.  Cardiovascular: RRR, normal s1/s2, no murmurs, rubs, or gallops appreciated.  2+ distal pulses equal bilaterally.  GI:  Soft, NT, ND. No hepatosplenomegaly.   MSK:     Hands: Non-tender. No swelling/synovitis. Full ROM. Able to make fist.    Wrists: Non-tender. No swelling/synovitis. Full ROM.    Elbows: Non-tender. No swelling/synovitis. Full ROM.    Shoulders: Non-tender. Full ROM.    Neck: Non-tender to mid-line palpation. Full ROM.    Back: Non-tender to mid-line palpation & SI joints. No deformities. Full ROM.    Hips: Non-tender. Full ROM.    Knees: Non-tender. No swelling/synovitis. Full ROM.    Ankles: Non-tender. No swelling/synovitis. Full ROM.    Feet: Non-tender. No swelling/synovitis. Full ROM.   Neuro:  AAO x 3.  Able to move all extremities.   Skin:  No rashes, nodules or dry skin noted.  Small bruise on left breast and healing small bruise on left shoulder.  Psych: Normal mood and affect. Normal thought process.      Test Results  Office Visit on 01/02/2023   Component Date Value    AST 01/02/2023 16     ALT 01/02/2023 20     BUN 01/02/2023 9     Creatinine 01/02/2023 0.58     eGFR CKD-EPI (2021) Fema* 01/02/2023 >90     Albumin 01/02/2023 3.7     PT 01/02/2023 10.8     INR 01/02/2023 0.96     APTT 01/02/2023 33.7     Heparin Correlation 01/02/2023 0.2     WBC 01/02/2023 10.1     RBC 01/02/2023 4.37     HGB 01/02/2023 12.3     HCT 01/02/2023 37.7     MCV 01/02/2023 86.3     MCH 01/02/2023 28.1     MCHC 01/02/2023 32.6     RDW 01/02/2023 16.2 (H)     MPV 01/02/2023 8.5     Platelet 01/02/2023 311     Neutrophils % 01/02/2023 54.6     Lymphocytes % 01/02/2023 36.7     Monocytes % 01/02/2023 6.7     Eosinophils % 01/02/2023 1.3     Basophils % 01/02/2023 0.7     Absolute Neutrophils 01/02/2023 5.5     Absolute Lymphocytes 01/02/2023 3.7 (H)     Absolute Monocytes 01/02/2023 0.7     Absolute Eosinophils 01/02/2023 0.1     Absolute Basophils 01/02/2023 0.1       Latest Reference Range & Units 05/19/22 10:54   Quantiferon TB Gold Plus Interpretation Negative  Negative   Quantiferon TB NIL value IU/mL 0.05   Quantiferon Antigen 1 minus Nil IU/mL 0.04   Quantiferon Antigen 2 minus NIL IU/mL 0.04   Quantiferon Mitogen IU/mL 9.95

## 2023-01-02 NOTE — Unmapped (Addendum)
Your Rheumatologist today was Dr. Nathen May     Summary of Plan for 01/02/23    Today we discussed the following:    Stop prednisone   Continue humira injections every other week  Labs today to work up the easy bruising   If the lab work is abnormal I will refer you to a hematologist for bruising.   Follow up in 3 months       When should I expect results?   Please note that it may take up to 21 days for results to return, but some may be completed within 24 hours. Please wait for me to respond to your results before sending your questions. If you have not been notified by phone, mail or [facility] (if applicable) in 21 days, please contact our office at [phone] or via [facility] messaging [email or web address])     What do I do if I have questions or concerns after the visit?   If you have non-urgent questions, the best way to contact your provider is to send a MyChart message by visiting [email or web address].  You can also use MyChart to request refills and access test results.  Providers will do their best to respond within 2-3 business days, although it may take longer in some circumstances.  If you have immediate concerns, please contact our clinic by phone  at [phone].     Need assistance scheduling appointments?   Call [facility] at [phone] for appointment assistance. If you are an established patient you can manage your appointments through [facility].     Thank you for allowing [facility] to be involved in your care!     [facility] at [facility]  [address], [address] Floor   [place], [place] [address]  Phone:  [phone]  Fax:  [phone]

## 2023-01-05 NOTE — Unmapped (Signed)
Called patient to discuss recent labs with help of a Engineer, structural. Patient did not answer phone, so left a voice message requesting patient call back the clinic.    Nathen May, DO  Rheumatology, PGY-5

## 2023-01-12 NOTE — Unmapped (Signed)
[facility] Refill Coordination Note    Specialty Medication(s) to be Shipped:   Inflammatory Disorders: Humira    Other medication(s) to be shipped: No additional medications requested for fill at this time     Marie Cole, DOB: Dec 05, 2000  Phone: [phone] (home)       All above HIPAA information was verified with patient.     Was a Nurse, learning disability used for this call? No    Completed refill call assessment today to schedule patient's medication shipment from the [facility] [phone]).  All relevant notes have been reviewed.     Specialty medication(s) and dose(s) confirmed: Regimen is correct and unchanged.   Changes to medications: Kaity reports no changes at this time.  Changes to insurance: No  New side effects reported not previously addressed with a pharmacist or physician: None reported  Questions for the pharmacist: No    Confirmed patient received a Conservation officer, historic buildings and a Surveyor, mining with first shipment. The patient will receive a drug information handout for each medication shipped and additional FDA Medication Guides as required.       DISEASE/MEDICATION-SPECIFIC INFORMATION        For patients on injectable medications: Patient currently has 0 doses left.  Next injection is scheduled for 8/8.    SPECIALTY MEDICATION ADHERENCE     Medication Adherence    Patient reported X missed doses in the last month: 0  Specialty Medication: Humira 40mg /0.22mL - 1q14d  Patient is on additional specialty medications: No  Patient is on more than two specialty medications: No  Informant: patient              Were doses missed due to medication being on hold? No    Humira 40  mg/0.49mL : 0 dose of medicine on hand      REFERRAL TO PHARMACIST     Referral to the pharmacist: Not needed      [facility]     Shipping address confirmed in Epic.       Delivery Scheduled: Yes, Expected medication delivery date: 7/26.     Medication will be delivered via Same Day Courier to the prescription address in Epic WAM.    Teofilo Pod, PharmD   [facility] Specialty Pharmacist

## 2023-01-12 NOTE — Unmapped (Signed)
The [facility] has made a second and final attempt to reach this patient to refill the following medication:HUMIRA(CF) PEN 40 mg/0.4 mL injection (adalimumab).      We have left voicemails on the following phone numbers: [phone], have sent a text message to the following phone numbers: [phone], and have sent a Mychart questionnaire..    Dates contacted: 01/06/23-01/12/23  Last scheduled delivery: 12/16/22    The patient may be at risk of non-compliance with this medication. The patient should call the [facility] at [phone]  Option 4, then Option 2: Dermatology, Gastroenterology, Rheumatology to refill medication.    Craige Cotta   [facility] Specialty Technician

## 2023-01-13 MED FILL — HUMIRA PEN CITRATE FREE 40 MG/0.4 ML: SUBCUTANEOUS | 28 days supply | Qty: 2 | Fill #3

## 2023-02-10 NOTE — Unmapped (Signed)
The [facility] has made a second and final attempt to reach this patient to refill the following medication:HUMIRA(CF) PEN 40 mg/0.4 mL injection (adalimumab).      We have left voicemails on the following phone numbers: [phone], have sent a text message to the following phone numbers: [phone], and have sent a Mychart questionnaire..    Dates contacted: 02/03/23-02/10/23  Last scheduled delivery: 01/13/23    The patient may be at risk of non-compliance with this medication. The patient should call the [facility] at [phone]  Option 4, then Option 2: Dermatology, Gastroenterology, Rheumatology to refill medication.    Craige Cotta   [facility] Specialty Technician

## 2023-02-23 NOTE — Unmapped (Signed)
[facility] RHEUMATOLOGY CLINIC - PHARMACIST NOTES    The [facility] has not been able to reach patient for refill of Humira.  Last shipment went out on 01/13/23 for a 1 month supply.  Attempted to check in patient today.  Unable to get in touch with patient, left voicemail requesting call back to [facility] at [phone], option 4.     Waverly Ferrari  PharmD Candidate [address] [employer] Ambulatory Care Intern

## 2023-02-28 NOTE — Unmapped (Signed)
[facility] Refill Coordination Note    Specialty Medication(s) to be Shipped:   Inflammatory Disorders: Humira    Other medication(s) to be shipped: No additional medications requested for fill at this time     Marie Cole, DOB: 2001/06/02  Phone: [phone] (home)       All above HIPAA information was verified with patient.     Was a Nurse, learning disability used for this call? No    Completed refill call assessment today to schedule patient's medication shipment from the [facility] [phone]).  All relevant notes have been reviewed.     Specialty medication(s) and dose(s) confirmed: Regimen is correct and unchanged.   Changes to medications: Icis reports no changes at this time.  Changes to insurance: No  New side effects reported not previously addressed with a pharmacist or physician: None reported  Questions for the pharmacist: No    Confirmed patient received a Conservation officer, historic buildings and a Surveyor, mining with first shipment. The patient will receive a drug information handout for each medication shipped and additional FDA Medication Guides as required.       DISEASE/MEDICATION-SPECIFIC INFORMATION        For patients on injectable medications: Patient currently has 0 doses left.  Next injection is scheduled for 9/11.    SPECIALTY MEDICATION ADHERENCE     Medication Adherence    Patient reported X missed doses in the last month: 0  Specialty Medication: HUMIRA(CF) PEN 40 mg/0.4 mL injection (adalimumab)  Patient is on additional specialty medications: No  Informant: patient              Were doses missed due to medication being on hold? No    Humira 40  mg/0.53mL : 0 dose of medicine on hand      REFERRAL TO PHARMACIST     Referral to the pharmacist: Not needed      [facility]     Shipping address confirmed in Epic.       Delivery Scheduled: Yes, Expected medication delivery date: 9/12.     Medication will be delivered via Same Day Courier to the prescription address in Epic WAM.    Alwyn Pea   [facility] Specialty Technician

## 2023-03-02 MED FILL — HUMIRA PEN CITRATE FREE 40 MG/0.4 ML: SUBCUTANEOUS | 28 days supply | Qty: 2 | Fill #4

## 2023-03-07 NOTE — Unmapped (Signed)
[facility] Clinical Intervention    Type of intervention: Side effect management    Medication involved: Humira    Problem identified: Patient starting experiencing blood in the throat and phlegm. She was concerned that this could be from the Humira (TB infections or Pneumonia).    Intervention performed: I spoke with the patient about seeking medical attention if this gets worse and that if she's concerned about an infection that she'll need to get lab work done to rule out TB. The last tuberculosis screening resulted in a non-reactive Quantiferon TB Gold assay on 05/19/22.     Follow-up needed: No    Approximate time spent: 10-15 minutes    Clinical evidence used to support intervention: Chart review    Result of the intervention: Improved medication adherence    Marie Cole, PharmD   [facility] Specialty Pharmacist

## 2023-04-04 NOTE — Unmapped (Signed)
[facility] Refill Coordination Note    Specialty Medication(s) to be Shipped:   Inflammatory Disorders: Humira    Other medication(s) to be shipped: No additional medications requested for fill at this time     Marie Cole, DOB: [birth year]  Phone: [phone] (home)       All above HIPAA information was verified with patient.     Was a Nurse, learning disability used for this call? No    Completed refill call assessment today to schedule patient's medication shipment from the [facility] [phone]).  All relevant notes have been reviewed.     Specialty medication(s) and dose(s) confirmed: Regimen is correct and unchanged.   Changes to medications: [name] reports no changes at this time.  Changes to insurance: No  New side effects reported not previously addressed with a pharmacist or physician: None reported  Questions for the pharmacist: No    Confirmed patient received a Conservation officer, historic buildings and a Surveyor, mining with first shipment. The patient will receive a drug information handout for each medication shipped and additional FDA Medication Guides as required.       DISEASE/MEDICATION-SPECIFIC INFORMATION        For patients on injectable medications: Patient currently has 0 doses left.  Next injection is scheduled for  around 10/22. Patient says she thinks she took last dose on 10/8.    SPECIALTY MEDICATION ADHERENCE     Medication Adherence    Patient reported X missed doses in the last month: 0  Specialty Medication: HUMIRA(CF) PEN 40 mg/0.4 mL injection (adalimumab)  Patient is on additional specialty medications: No  Informant: patient              Were doses missed due to medication being on hold? No    Humira 40  mg/0.68mL : 0 dose of medicine on hand      REFERRAL TO PHARMACIST     Referral to the pharmacist: Not needed      [facility]     Shipping address confirmed in Epic.       Delivery Scheduled: Yes, Expected medication delivery date: 10/18.     Medication will be delivered via Same Day Courier to the prescription address in Epic WAM.    Marie Cole   [facility] Specialty Technician

## 2023-04-07 MED FILL — HUMIRA PEN CITRATE FREE 40 MG/0.4 ML: SUBCUTANEOUS | 28 days supply | Qty: 2 | Fill #5

## 2023-04-09 NOTE — Unmapped (Signed)
RHEUMATOLOGY CLINIC FOLLOW-UP NOTE      Assessment/Plan:      Marie Cole is a 22 y.o. female with a history of iron deficiency anemia, subclinical hypothyroidism, GERD, and currently breast feeding who presents for follow up of seropositive rheumatoid arthritis.     Seropositive Rheumatoid arthritis:   Positive RF 397.3 and positive anti-CCP antibody > 340. Xray hands 11/0/23 without erosive disease. Joint pain and inflammation improved with initiation of humira. Only endorsing left shoulder pain today.  - continue Humira 40 mg every 14 days   - will defer starting methotrexate since patient still breast feeding; consider in the future    High risk medication use   - Patient is currently taking Humira, immunosuppressant medications that require intensive monitoring. This monitoring was done today through history, physical, and/or lab testing.   - Reviewed CBC, BUN, Cr, albumin, AST, and ALT from 01/02/23 without signs of medication toxicity.  - repeat medication monitoring labs today.     Health Maintenance  - PCV-20: Up to date   - Annual flu vaccination: Up to date   - COVID Vaccination: Due, patient declined vaccination   - Contraception: not taking teratogenic medication      Return in about 4 months (around 08/11/2023).    This patient was discussed with Dr. Sullivan Lone who agrees with the plan outlined above.    Nathen May, DO  Rheumatology Fellow, PGY-5    Subjective:   Primary Care Provider: Aaron Mose, MD    HPI:  Marie Cole is a 22 y.o.  female with a PMH of iron deficiency anemia, subclinical hypothyroidism, GERD, and currently breast feeding presenting for follow-up of seropositive rheumatoid arthritis.      Last seen in clinic 01/02/23 with concern of easy bruising. Work up for coagulopathy and TCP negative. She was continued on humira. The pharmacy has been unable to reach her to refill the medication so concern for medication non-adherence.     Today patient reports she reports joint pain that worsens after exercise but is overall improved. She has been on Humira for 3 months. The medication has improved her joint pain and joint swelling. After the first few doses she had vomiting and headaches. This improved with her additional injections. She still gets mild nausea after the injection for 1-2 days. She denies any other side effects from the medication. She would prefer to continue Humira instead of changing therapies.     She reports redness on her cheeks all the time. Denies trouble breathing.     Disease History:  Started having joint pain during a  hospitalization 04/15/22 for acute liver injury from cholelithiasis sp cholecystectomy on 10/29. At the time attributed to possible reactive viral arthritis that she was positive for rhinovirus and enterovirus.  But pain persisted and she was having trouble performing ADLs and taking care of her new baby.  Her pain involves multiple sites including hands, feet, knees and shoulders.  Found to have positive RF 397.3 and positive anti-CCP antibody > 340 also ANA +1: 80. Established care with [facility] 06/11/23. Started plaquenil since safe in breastfeeding. seen in clinic 10/10/22 started on humira since she was breast feeding.     Prior treatments:  - hydroxychloroquine -- discontinued due to patient reported fatigue, chest pressure, and redness in her face  - humira started 10/10/2022    PMH, PFH, PSH as previously documented. Changes addressed in HPI    Medications were reviewed this visit.  Current  Outpatient Medications on File Prior to Visit   Medication Sig    ADALIMUMAB PEN CITRATE FREE 40 MG/0.4 ML Inject the contents of 1 pen (40 mg total) under the skin every fourteen (14) days.    multivitamin, prenatal, folic acid-iron, 27-1 mg Tab Take 1 tablet by mouth daily.    diclofenac sodium (VOLTAREN) 1 % gel Apply 4 g topically four (4) times a day as needed for arthritis or pain. (Patient not taking: Reported on 04/10/2023) empty container Misc Use to dispose your Humira medication (Patient not taking: Reported on 04/10/2023)    famotidine (PEPCID) 20 MG tablet Take 1 tablet (20 mg total) by mouth two (2) times a day as needed for heartburn. (Patient not taking: Reported on 07/22/2022)    norgestimate-ethinyl estradiol (ORTHO TRI-CYCLEN, 28,) 0.18/0.215/0.25 mg-35 mcg (28) per tablet Take 1 tablet by mouth daily. (Patient not taking: Reported on 04/10/2023)     No current facility-administered medications on file prior to visit.       Immunization were reviewed this visit.  Immunization History   Administered Date(s) Administered    COVID-19 VACCINE,MRNA(MODERNA)(PF) 09/18/2020, 11/20/2020    INFLUENZA VACCINE QUAD (IIV4 PF) 47MO+ INJECTABLE  06/06/2019, 08/23/2021    Influenza Vaccine Quad(IM)6 MO-Adult(PF) 06/06/2019, 08/23/2021    Influenza Virus Vaccine, unspecified formulation 06/17/2019, 08/23/2021    MMR 08/31/2019, 10/14/2019    Pneumococcal Conjugate 20-valent 07/22/2022    TdaP 06/06/2019, 11/16/2021       Objective:     Vitals:    04/10/23 1533   BP: 121/87   Pulse: 89   Temp: 36.4 ??C (97.5 ??F)   TempSrc: Temporal   Weight: (!) 106.1 kg (234 lb)         Body mass index is 38.94 kg/m??.    Physical Exam  Gen: no acute distress.  Well nourished and well kempt.  Head: No alopecia. [place]/AT.  Eyes: EOMI. PERRL. No conjunctival injection. No scleral icterus. Adequate tear meniscus.   Ears: Normal external exam. Normal TM's.  Nose/Oral:  No eyrthema in the nares or nasal polyps.  Clear oropharynx. No oral ulcers. Adequate sublingual salivary pooling.   Neck:  Supple, no JVD.  No thyromegaly.  No carotid bruit.   Lymph: No lymphadenopathy of cervical, supraclavicular, axillary or inguinal areas.  Chest: No increased work of breathing. CTAB.  Cardiovascular: RRR, normal s1/s2, no murmurs, rubs, or gallops appreciated.  2+ distal pulses equal bilaterally.  MSK:     Hands: Non-tender. No swelling/synovitis. Full ROM. Able to make fist. Wrists: Non-tender. No swelling/synovitis. Full ROM.    Elbows: Non-tender. No swelling/synovitis. Full ROM.    Shoulders: tenderness left shoulder. Full ROM.    Neck: Non-tender to mid-line palpation. Full ROM.    Back: Non-tender to mid-line palpation & SI joints. No deformities. Full ROM.    Hips: Non-tender. Full ROM.    Knees: Non-tender. No swelling/synovitis. Full ROM.    Ankles: Non-tender. No swelling/synovitis. Full ROM.    Feet: Non-tender. No swelling/synovitis. Full ROM.   Neuro:  AAO x 3.  Able to move all extremities.   Skin:  No rashes, nodules or dry skin noted.    Psych: Normal mood and affect. Normal thought process.    Swollen Joint Count (0-28): 0  Tender Joint Count (0-28): 1  Patient Global Assessment of Disease Activity (0-10): 2  Evaluator Global Assessment of Disease (0-10): 2  CDAI Score: 5  CDAI interpretation:  0.0-2.8 Remission   2.9-10.0 Low disease activity  10.1-22.0 Moderate disease activity   22.1-76.0 High disease activity       Test Results  No visits with results within 4 Week(s) from this visit.   Latest known visit with results is:   Office Visit on 01/02/2023   Component Date Value    AST 01/02/2023 16     ALT 01/02/2023 20     BUN 01/02/2023 9     Creatinine 01/02/2023 0.58     eGFR CKD-EPI (2021) Fema* 01/02/2023 >90     Albumin 01/02/2023 3.7     PT 01/02/2023 10.8     INR 01/02/2023 0.96     APTT 01/02/2023 33.7     Heparin Correlation 01/02/2023 0.2     WBC 01/02/2023 10.1     RBC 01/02/2023 4.37     HGB 01/02/2023 12.3     HCT 01/02/2023 37.7     MCV 01/02/2023 86.3     MCH 01/02/2023 28.1     MCHC 01/02/2023 32.6     RDW 01/02/2023 16.2 (H)     MPV 01/02/2023 8.5     Platelet 01/02/2023 311     Neutrophils % 01/02/2023 54.6     Lymphocytes % 01/02/2023 36.7     Monocytes % 01/02/2023 6.7     Eosinophils % 01/02/2023 1.3     Basophils % 01/02/2023 0.7     Absolute Neutrophils 01/02/2023 5.5     Absolute Lymphocytes 01/02/2023 3.7 (H)     Absolute Monocytes 01/02/2023 0.7     Absolute Eosinophils 01/02/2023 0.1     Absolute Basophils 01/02/2023 0.1

## 2023-04-10 ENCOUNTER — Ambulatory Visit
Admit: 2023-04-10 | Discharge: 2023-04-10 | Payer: PRIVATE HEALTH INSURANCE | Attending: Student in an Organized Health Care Education/Training Program | Primary: Student in an Organized Health Care Education/Training Program

## 2023-04-10 ENCOUNTER — Ambulatory Visit: Admit: 2023-04-10 | Discharge: 2023-04-10 | Payer: PRIVATE HEALTH INSURANCE

## 2023-04-10 DIAGNOSIS — M0579 Rheumatoid arthritis with rheumatoid factor of multiple sites without organ or systems involvement: Principal | ICD-10-CM

## 2023-04-10 LAB — AST: AST (SGOT): 61 U/L — ABNORMAL HIGH (ref <=34)

## 2023-04-10 LAB — CBC W/ AUTO DIFF
BASOPHILS ABSOLUTE COUNT: 0.1 10*9/L (ref 0.0–0.1)
BASOPHILS RELATIVE PERCENT: 0.6 %
EOSINOPHILS ABSOLUTE COUNT: 0.2 10*9/L (ref 0.0–0.5)
EOSINOPHILS RELATIVE PERCENT: 1.8 %
HEMATOCRIT: 39.1 % (ref 34.0–44.0)
HEMOGLOBIN: 12.9 g/dL (ref 11.3–14.9)
LYMPHOCYTES ABSOLUTE COUNT: 4 10*9/L — ABNORMAL HIGH (ref 1.1–3.6)
LYMPHOCYTES RELATIVE PERCENT: 36.2 %
MEAN CORPUSCULAR HEMOGLOBIN CONC: 33.1 g/dL (ref 32.0–36.0)
MEAN CORPUSCULAR HEMOGLOBIN: 29.1 pg (ref 25.9–32.4)
MEAN CORPUSCULAR VOLUME: 87.9 fL (ref 77.6–95.7)
MEAN PLATELET VOLUME: 8.4 fL (ref 6.8–10.7)
MONOCYTES ABSOLUTE COUNT: 0.7 10*9/L (ref 0.3–0.8)
MONOCYTES RELATIVE PERCENT: 6.3 %
NEUTROPHILS ABSOLUTE COUNT: 6.1 10*9/L (ref 1.8–7.8)
NEUTROPHILS RELATIVE PERCENT: 55.1 %
PLATELET COUNT: 341 10*9/L (ref 150–450)
RED BLOOD CELL COUNT: 4.45 10*12/L (ref 3.95–5.13)
RED CELL DISTRIBUTION WIDTH: 14.1 % (ref 12.2–15.2)
WBC ADJUSTED: 11 10*9/L (ref 3.6–11.2)

## 2023-04-10 LAB — CREATININE
CREATININE: 0.56 mg/dL
EGFR CKD-EPI (2021) FEMALE: >90 mL/min/{1.73_m2} (ref >=60)

## 2023-04-10 LAB — ALBUMIN: ALBUMIN: 4 g/dL (ref 3.4–5.0)

## 2023-04-10 LAB — BUN: BLOOD UREA NITROGEN: 9 mg/dL (ref 9–23)

## 2023-04-10 LAB — ALT: ALT (SGPT): 74 U/L — ABNORMAL HIGH (ref 10–49)

## 2023-04-10 NOTE — Unmapped (Signed)
I reviewed the case with the fellow but did not see the patient.  I agree with the assessment and plan as documented in the fellow's note.    Nakita Santerre, MD, MSCI  Assistant Professor of Medicine  Department of Medicine/Division of Rheumatology  [employer] [facility] [place]  [phone] clinic phone  [phone] clinic secure fax

## 2023-04-10 NOTE — Unmapped (Addendum)
Your Rheumatologist today was Dr. Nathen May     Summary of Plan for 04/10/23    Today we discussed the following:    Continue Humira injections.  Please call the [facility] specialty pharmacy at [phone] opt 4. To schedule your Humira refill.   Labs today.   Follow up in 4 months.     En Espanol:   1. Contin??e con las inyecciones de Humira.  2. Llame a la farmacia [facility] al [phone] opci??n 4. Para programar la recarga de Humira.   3. Los laboratorios en la actualidad.   Llame a la [facility] si tiene Physiological scientist. [phone]  4. Seguimiento en 4 meses.     When should I expect results?   Please note that it may take up to 21 days for results to return, but some may be completed within 24 hours. Please wait for me to respond to your results before sending your questions. If you have not been notified by phone, mail or [facility] (if applicable) in 21 days, please contact our office at [phone] or via [facility] messaging [email or web address])     What do I do if I have questions or concerns after the visit?   If you have non-urgent questions, the best way to contact your provider is to send a MyChart message by visiting [email or web address].  You can also use MyChart to request refills and access test results.  Providers will do their best to respond within 2-3 business days, although it may take longer in some circumstances.  If you have immediate concerns, please contact our clinic by phone  at [phone].     Need assistance scheduling appointments?   Call [facility] at [phone] for appointment assistance. If you are an established patient you can manage your appointments through [facility].     Thank you for allowing [facility] to be involved in your care!     [facility] at [facility]  [address], [address] Floor   [place], [place] [address]  Phone:  [phone]  Fax:  [phone]

## 2023-04-25 DIAGNOSIS — R748 Abnormal levels of other serum enzymes: Principal | ICD-10-CM

## 2023-04-26 ENCOUNTER — Ambulatory Visit: Payer: Medicaid Other

## 2023-04-26 ENCOUNTER — Encounter: Payer: Self-pay | Admitting: Advanced Practice Midwife

## 2023-04-26 ENCOUNTER — Ambulatory Visit (LOCAL_COMMUNITY_HEALTH_CENTER): Payer: Medicaid Other | Admitting: Advanced Practice Midwife

## 2023-04-26 VITALS — BP 145/75 | Ht 65.0 in | Wt 237.0 lb

## 2023-04-26 DIAGNOSIS — M069 Rheumatoid arthritis, unspecified: Secondary | ICD-10-CM | POA: Insufficient documentation

## 2023-04-26 DIAGNOSIS — Z309 Encounter for contraceptive management, unspecified: Secondary | ICD-10-CM

## 2023-04-26 DIAGNOSIS — F17200 Nicotine dependence, unspecified, uncomplicated: Secondary | ICD-10-CM

## 2023-04-26 DIAGNOSIS — Z3202 Encounter for pregnancy test, result negative: Secondary | ICD-10-CM | POA: Diagnosis not present

## 2023-04-26 DIAGNOSIS — Z3009 Encounter for other general counseling and advice on contraception: Secondary | ICD-10-CM

## 2023-04-26 DIAGNOSIS — E669 Obesity, unspecified: Secondary | ICD-10-CM | POA: Insufficient documentation

## 2023-04-26 HISTORY — DX: Rheumatoid arthritis, unspecified: M06.9

## 2023-04-26 LAB — HM HIV SCREENING LAB: HM HIV Screening: NEGATIVE

## 2023-04-26 LAB — WET PREP FOR TRICH, YEAST, CLUE
Trichomonas Exam: NEGATIVE
Yeast Exam: NEGATIVE

## 2023-04-26 LAB — HEMOGLOBIN, FINGERSTICK: Hemoglobin: 12.9 g/dL (ref 11.1–15.9)

## 2023-04-26 LAB — PREGNANCY, URINE: Preg Test, Ur: NEGATIVE

## 2023-04-26 NOTE — Progress Notes (Signed)
Pt is here for family planning visit.  Family planning packet reviewed and given to pt.  Wet prep results reviewed, no treatment required per standing orders. Condoms given.  Gaspar Garbe, RN

## 2023-04-26 NOTE — Progress Notes (Signed)
[facility] [address] Main Number: [phone]   Family Planning Visit- Initial Visit  Subjective:  Natalie Jenkins is a 22 y.o. SHF exsmoker [id number] (3,1)  being seen today for an initial annual visit and to discuss reproductive life planning.  The patient is currently using Female Condom for pregnancy prevention. Patient reports   does want a pregnancy in the next year.     report they are looking for a method that provides Other wants to conceive and then get BTL  Patient has the following medical conditions has Hx depression; Obesity affecting pregnancy BMI=36.6; Supervision of high risk pregnancy in first trimester; Abnormal laboratory test TSH elevated and free T4 wnl, T3 low on 07/26/21;  subclinical hypothyroidism 07/26/21; Gallstones; and Rheumatoid arthritis (HCC) dx'd 06/11/23 on their problem list.  No chief complaint on file.   Patient reports here for physical. Was thinking of Nexplanon but now wants another child and then BTL. Last sex 04/21/23 with condom; with current partner x 4 years. LMP 02/24/23. Last cig 2019. Last ETOH 2022 (Tequ[place]). Last dental exam as a teen. Highest grade completed 12th. Not working. Living with 2 kids, FOB and his 53 yo daughter. Nursing 1 yo son all day and night. 3 yo son nonverbal and pt states [employer] not concerned. SVD 02/01/22 and DMPA 03/18/22. Dx'd with rheumatoid arthritis 06/10/22 and given Plaquenil which she dc'd and began Humira 40 mg 10/10/22.  Last pap 10/14/19 neg. Last PE 03/18/22.   Patient denies vaping, cigars, MJ  Body mass index is 39.44 kg/m. - Patient is eligible for diabetes screening based on BMI> 25 and age >35?  not applicable HA1C ordered? not applicable  Patient reports 1  partner/s in last year. Desires STI screening?  Yes  Has patient been screened once for HCV in the past?  Yes  No results found for: "HCVAB"  Does the patient have current drug  use (including MJ), have a partner with drug use, and/or has been incarcerated since last result? No  If yes-- Screen for HCV through [facility]   Does the patient meet criteria for HBV testing? No  Criteria:  -Household, sexual or needle sharing contact with HBV -History of drug use -HIV positive -Those with known Hep C   Health Maintenance Due  Topic Date Due   HPV VACCINES (1 - 3-dose series) Never done   Cervical Cancer Screening (Pap smear)  10/14/2022   CHLAMYDIA SCREENING  01/13/2023   COVID-19 Vaccine (3 - 2023-24 season) 02/19/2023    Review of Systems  Constitutional:  Positive for weight loss (39 lb wt gain since birth of baby 1 year ago; walking 2-3x/wk x 45 min).  Eyes:  Positive for photophobia (c/o black spots for a "longtime"; last eye exam 2019; referred to opthamologist).  Neurological:  Positive for headaches (pt states onset with Humera injections q 14 days relieved with Tylenol).    The following portions of the patient's history were reviewed and updated as appropriate: allergies, current medications, past family history, past medical history, past social history, past surgical history and problem list. Problem list updated.   See flowsheet for other program required questions.  Objective:   Vitals:   04/26/23 1332  Weight: 237 lb (107.5 kg)  Height: 5\' 5"  (1.651 m)    Physical Exam Constitutional:      Appearance: Normal appearance. She is obese.  HENT:     Head: Normocephalic and atraumatic.  Mouth/Throat:     Mouth: Mucous membranes are moist.     Comments: Last dental exam as a teen Eyes:     Conjunctiva/sclera: Conjunctivae normal.  Neck:     Thyroid: No thyroid mass, thyromegaly or thyroid tenderness.  Cardiovascular:     Rate and Rhythm: Normal rate and regular rhythm.  Pulmonary:     Effort: Pulmonary effort is normal.     Breath sounds: Normal breath sounds.  Chest:     Comments: Breastfeeding 1 yo son Abdominal:      Palpations: Abdomen is soft.     Comments: Soft without masses or tenderness, poor tone, increased adipose  Genitourinary:    General: Normal vulva.     Exam position: Lithotomy position.     Pubic Area: No pubic lice.      Vagina: Vaginal discharge (white creamy leukorrhea, ph<4.5) present.     Cervix: Normal.     Uterus: Normal.      Adnexa: Right adnexa normal and left adnexa normal.     Rectum: Normal.     Comments: No evidence of nits Pap done Musculoskeletal:        General: Normal range of motion.     Cervical back: Normal range of motion and neck supple.  Skin:    General: Skin is warm and dry.  Neurological:     Mental Status: She is alert.  Psychiatric:        Mood and Affect: Mood normal.      Assessment and Plan:  Natalie Jenkins is a 22 y.o. female presenting to the [facility] Department for an initial annual wellness/contraceptive visit  Contraception counseling: Reviewed options based on patient desire and reproductive life plan. Patient is interested in No Method - No Contraceptive Precautions. This was provided to the patient today.  if not why not clearly documented  Risks, benefits, and typical effectiveness rates were reviewed.  Questions were answered.  Written information was also given to the patient to review.    The patient will follow up in  prn when/if wants birth control     for surveillance.  The patient was told to call with any further questions, or with any concerns about this method of contraception.  Emphasized use of condoms 100% of the time for STI prevention.  Educated on ECP and assessed for need of ECP. Patient reported > 120 hours .  Reviewed options and patient desired No method of ECP, declined all    1. Family planning Treat wet mount per standing orders Immunization nurse consult Referred pt to [facility] due to nonverbal 98 yo son who is in to interview pt Pt chooses to conceive and doesn't want birth  control today  - Pregnancy, urine - WET PREP FOR TRICH, YEAST, CLUE - Hemoglobin, venipuncture - Chlamydia/Gonorrhea [place] [facility] - HIV [place] [facility] - Syphilis Serology, [facility] - Pap IG (Image Guided)  2. Rheumatoid arthritis, involving unspecified site, unspecified whether rheumatoid factor present (HCC) Humira 40 mg since 10/10/22   No follow-ups on file.  No future appointments.  Alberteen Spindle, CNM

## 2023-05-01 LAB — PAP IG (IMAGE GUIDED): PAP Smear Comment: 0

## 2023-05-01 NOTE — Unmapped (Signed)
[facility] Refill Coordination Note    Specialty Medication(s) to be Shipped:   Inflammatory Disorders: Humira    Other medication(s) to be shipped: No additional medications requested for fill at this time     Marie Cole, DOB: December 28, 2000  Phone: [phone] (home)       All above HIPAA information was verified with patient.     Was a Nurse, learning disability used for this call? Yes, [id number]. Patient language is appropriate in [facility]    Completed refill call assessment today to schedule patient's medication shipment from the [facility]  [phone]).  All relevant notes have been reviewed.     Specialty medication(s) and dose(s) confirmed: Regimen is correct and unchanged.   Changes to medications: Marie Cole reports no changes at this time.  Changes to insurance: No  New side effects reported not previously addressed with a pharmacist or physician: None reported  Questions for the pharmacist: No    Confirmed patient received a Conservation officer, historic buildings and a Surveyor, mining with first shipment. The patient will receive a drug information handout for each medication shipped and additional FDA Medication Guides as required.       DISEASE/MEDICATION-SPECIFIC INFORMATION        For patients on injectable medications: Patient currently has 0 doses left.  Next injection is scheduled for 05/06/23.    SPECIALTY MEDICATION ADHERENCE     Medication Adherence    Patient reported X missed doses in the last month: 0  Specialty Medication: HUMIRA(CF) PEN 40 mg/0.4 mL injection (adalimumab)  Patient is on additional specialty medications: No  Informant: patient              Were doses missed due to medication being on hold? No      HUMIRA(CF) PEN 40 mg/0.4 mL injection (adalimumab): 0 doses of medicine on hand     REFERRAL TO PHARMACIST     Referral to the pharmacist: Not needed      [facility]     Shipping address confirmed in Epic.       Delivery Scheduled: Yes, Expected medication delivery date: 05/03/23.     Medication will be delivered via Same Day Courier to the prescription address in Epic WAM.    [facility]  Specialty Technician

## 2023-05-03 MED FILL — HUMIRA PEN CITRATE FREE 40 MG/0.4 ML: SUBCUTANEOUS | 28 days supply | Qty: 2 | Fill #6

## 2023-05-11 ENCOUNTER — Ambulatory Visit: Admit: 2023-05-11 | Discharge: 2023-05-12 | Payer: PRIVATE HEALTH INSURANCE

## 2023-05-11 DIAGNOSIS — Z23 Encounter for immunization: Principal | ICD-10-CM

## 2023-05-11 DIAGNOSIS — M0579 Rheumatoid arthritis with rheumatoid factor of multiple sites without organ or systems involvement: Principal | ICD-10-CM

## 2023-05-11 DIAGNOSIS — R748 Abnormal levels of other serum enzymes: Principal | ICD-10-CM

## 2023-05-11 DIAGNOSIS — Z13220 Encounter for screening for lipoid disorders: Principal | ICD-10-CM

## 2023-05-11 DIAGNOSIS — D509 Iron deficiency anemia, unspecified: Principal | ICD-10-CM

## 2023-05-11 DIAGNOSIS — R7401 Transaminitis: Principal | ICD-10-CM

## 2023-05-11 DIAGNOSIS — Z6838 Body mass index (BMI) 38.0-38.9, adult: Principal | ICD-10-CM

## 2023-05-11 LAB — HEPATIC FUNCTION PANEL
ALBUMIN: 3.8 g/dL (ref 3.4–5.0)
ALKALINE PHOSPHATASE: 167 U/L — ABNORMAL HIGH (ref 46–116)
ALT (SGPT): 62 U/L — ABNORMAL HIGH (ref 10–49)
AST (SGOT): 38 U/L — ABNORMAL HIGH (ref <=34)
BILIRUBIN DIRECT: <0.1 mg/dL (ref 0.00–0.30)
BILIRUBIN TOTAL: 0.2 mg/dL — ABNORMAL LOW (ref 0.3–1.2)
PROTEIN TOTAL: 7.9 g/dL (ref 5.7–8.2)

## 2023-05-11 LAB — LIPID PANEL
CHOLESTEROL/HDL RATIO SCREEN: 3.2 (ref 1.0–4.5)
CHOLESTEROL: 116 mg/dL (ref <=200)
HDL CHOLESTEROL: 36 mg/dL — ABNORMAL LOW (ref 40–60)
LDL CHOLESTEROL CALCULATED: 58 mg/dL (ref 40–99)
NON-HDL CHOLESTEROL: 80 mg/dL (ref 70–130)
TRIGLYCERIDES: 109 mg/dL (ref 0–150)
VLDL CHOLESTEROL CAL: 21.8 mg/dL (ref 8–29)

## 2023-05-11 LAB — FERRITIN: FERRITIN: 15.4 ng/mL (ref 7.3–270.7)

## 2023-05-11 NOTE — Unmapped (Addendum)
Ha sido un placer verte hoy.     Aqu?? hay un resumen de lo que discutimos hoy durante su visita:  1. Los laboratorios en la actualidad  2. La vacuna contra el VPH y la hepatitis B en la actualidad  3. Referencia a la p??rdida de peso y la nutrici??n  4. Nos vemos en 2 meses para el seguimiento y las pr??ximas vacunas    Domingo Dimes, MD, [facility] en [place]  [facility]    -------------------  It was great seeing you today.     Here's a summary of what we discussed today during your visit:  Labs today  HPV and hep B vaccine today  Referral to weight loss and nutrition  See you back in 2 months for follow up and next vaccines    Domingo Dimes, MD, PharmD  [facility]

## 2023-05-11 NOTE — Unmapped (Signed)
Internal Medicine Clinic Visit    Reason for visit: weight loss    A/P: 22 y.o. 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    1. BMI 38.0-38.9,adult    2. Rheumatoid arthritis involving multiple sites with positive rheumatoid factor (CMS-HCC)    3. Elevated liver enzymes    4. Transaminitis    5. Screening for lipoid disorders    6. Iron deficiency anemia, unspecified iron deficiency anemia type    7. Encounter for vaccination      BMI 38.0-38.9,adult  Reports weight gain with prednisone therapy for RA. She is no longer on prednisone but has been unable to lose the weight she gained despite diet and exercise. She is interested in gastric sleeve or possible medication management. She is planning to have another baby in the future but not currently trying. She is on OCPs which may be contributing to weight gain - discussed IUD but she declined. She is currently breastfeeding.  Wt Readings from Last 12 Encounters:   05/11/23 (!) 107.5 kg (237 lb)   04/10/23 (!) 106.1 kg (234 lb)   01/02/23 (!) 107 kg (236 lb)   10/10/22 (!) 108.4 kg (239 lb)   08/26/22 (!) 105.1 kg (231 lb 9.6 oz)   07/22/22 98.8 kg (217 lb 12.8 oz)   06/10/22 95.8 kg (211 lb 3.2 oz)   05/03/22 91.7 kg (202 lb 3.2 oz)   04/18/22 92.5 kg (203 lb 14.8 oz)   02/02/22 (!) 108.1 kg (238 lb 6.4 oz)   01/26/22 (!) 107.8 kg (237 lb 9.6 oz)   09/27/21 98 kg (216 lb)   - Ambulatory referral to Nutrition Services; Future  - Amb Referral for Weight Management; Future    Rheumatoid arthritis involving multiple sites with positive rheumatoid factor (CMS-HCC)  - Continue following with [facility] rheumatology  - Continue Humira  - No longer taking prednisone    Transaminitis  Last AST/ALT elevated, will repeat today.  - Hepatic Function Panel    Screening for lipoid disorders  - Lipid Panel    Iron deficiency  Last year ferritin still low at 8. Patient reports taking prenatals and iron supplementation. Will repeat ferritin today.  - Ferritin    Encounter for vaccination  - Hep B, adult, adjuvanted (2 dose schedule) IM. Will need 2nd dose after a month  - HPV vaccine quadrivalent 3 dose IM. Will need 2nd dose in 1-2 months and 3rd dose in 6 months    Not discussed today:  History of liver injury, suspected secondary to choledocholithiasis, s/p lap cholecystectomy 04/17/22  Admitted 10/27-10/30/23 for RUQ pain with nausea, non-bloody diarrhea, and bilious but non-bloody emesis that was exacerbated by eating, AST/ALT peaked >1000, tbili peaked at 1.7, mostly direct. Known history of cholelithiasis dating back to 09/2021. Acute hepatitis panel negative except hepatitis A IgG. MRCP with evidence of cholelithiasis without obstructing stone or acute cholecystitis, no biliary ductal dilation. She had a subcentimeter hyperenhancing lesion which likely represented a flash filling hemangioma. Patient underwent laparoscopic cholecystectomy 10/29.  - Repeat HFP today to ensure transaminases continue downtrending  - Patient will need hep B immunization    GERD  Patient reports some abdominal pain when she eats and when she goes to sleep, consistent with GERD.  - Famotidine 20 mg BID PRN  - Tums PRN    Healthcare Maintenance  Cardiovascular Risk:  - BP: controlled  - BMI: Body mass index is  39.44 kg/m??.  - HbA1c: WNL 5.2% on 04/15/2022, repeat in 3 years   - Lipid panel: Ordered today   - 10-yr ASCVD risk: The ASCVD Risk score (Arnett DK, et al., 2019) failed to calculate.   - Statin: Not indicated  - Aspirin 81 mg: Not indicated    Cancer screening:  - Colon: Not indicated  - Cervical: Negative pap on 04/26/23, repeat in 3 years   - Breast: Not indicated  - Lung: Not indicated   Tobacco Use: Low Risk  (05/11/2023)    Patient History     Smoking Tobacco Use: Never     Smokeless Tobacco Use: Never     Passive Exposure: Never   Recent Concern: Tobacco Use - Medium Risk (04/26/2023)    Received from [facility] Patient History     Smoking Tobacco Use: Former     Smokeless Tobacco Use: Never     Passive Exposure: Never     Immunizations:  - Flu shot: Up to date  - COVID: Will address at next visit  - Tdap/Td: Received 2023, repeat in 10 years  - Shingrix 50+: Not indicated  - Prevnar/Pneumovax 65+:  up to date with PCV20 in 2024  - HPV <69M/84F: 1st dose today    Other:  - Depression screening:     - Anxiety screening:    - HIV screen: Screened in 2023, negative  - HCV: Screened in 2023, negative  - HBV: Screened in 2023, negative but no longer immune. 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    Staffed with Dr. Mayford Knife, discussed  __________________________________________________________    HPI:  22 y.o. presents with weight concerns  Hasn't been feeling well because of her weight  Has tried diet and exercise  But when she exercises, the next day she can't get out of bed  No longer taking prednisone  After the delivery and surgery, she lost a lot of weight  Gained weight with prednisone, and now hasn't been able to lose weight  She is taking contraceptive pills  Has not been eating fatty, sugary foods, or salt  Stopped eating flour and soda  Has only been able to lose 4 lbs  Plan to have one more baby, then have surgery to not longer have babies  Interested in gastric sleeve  Taking prenatals, iron, b vitamins  __________________________________________________________    Medications: Reviewed in EPIC  __________________________________________________________    Physical Exam:   Vital Signs:  Vitals:    05/11/23 1513   BP: 103/71   BP Site: L Arm   BP Position: Sitting   BP Cuff Size: Large   Pulse: 92   Resp: 18   Temp: 36.7 ??C (98.1 ??F)   TempSrc: Temporal   SpO2: 97% Weight: (!) 107.5 kg (237 lb)   Height: 165.1 cm (5' 5)          PTHomeBP  The patient???s Average Home Blood Pressure during the last two weeks is :   /   based on  readings    Gen: Well appearing, NAD  CV: RRR, no murmurs  Pulm: CTAB, no crackles or wheezes  Abd: Soft, NTND, normoactive BS  Ext: No edema    PHQ-9 Score:     GAD-7  Score:       Medication adherence and barriers to the treatment plan have been addressed. Opportunities to optimize healthy behaviors have been discussed. Patient / caregiver voiced understanding.

## 2023-05-11 NOTE — Unmapped (Signed)
[facility] at [facility]     Reason for visit: Follow up    Questions / Concerns that need to be addressed: Weight    Screening BP- 103/71 P 92    [employer] BPs (complete if screening BP has a systolic  > 130 or diastolic > 80)  BP#1    BP#2   BP#3     Average BP   (please note this as a comment in vitals)     PTHomeBP     Diabetes:  Regularly checking blood sugars?: no  If yes, when? Complete log for past 7 days  Date Before Breakfast After Breakfast Before Lunch After Lunch Before Dinner After Dinner Before Bed                                                                                                                                   HCDM reviewed and updated in Epic:    We are working to make sure all of our patients??? wishes are updated in Epic and part of that is documenting a Environmental health practitioner for each patient  A Health Care Decision Maker is someone you choose who can make health care decisions for you if you are not able - who would you most want to do this for you????  is already up to date.      BPAs completed:  PHQ9    Annual Screenings:   Tobacco  __________________________________________________________________________________________    SCREENINGS COMPLETED IN FLOWSHEETS      AUDIT       PHQ2       PHQ9          GAD7       COPD Assessment       Falls Risk

## 2023-05-17 NOTE — Unmapped (Signed)
Second attempt made to call patient regarding her lab work with spanish interpreter. Call went straight to voicemail. Left message asking patient to return call to clinic. Also sent message in [place] about labs. Also sent letter in mail.     Nathen May, DO  Rheumatology, PGY-5

## 2023-06-02 ENCOUNTER — Institutional Professional Consult (permissible substitution)
Admit: 2023-06-02 | Discharge: 2023-06-03 | Payer: PRIVATE HEALTH INSURANCE | Attending: Registered" | Primary: Registered"

## 2023-06-02 NOTE — Unmapped (Signed)
Enhanced Care Nutrition-Initial Assessment    Nutrition Assessment:  Patient is a pleasant 22 y.o. who comes today desiring to lose weight. She reports it has been difficult for her. She has some trouble with exercise due to her RA. Patient had questions regarding bariatric surgery, answered these questions. Patient would likely be a candidate for gastric sleeve. She would like to pursue this for more information.    Nutrition Diagnosis:  Overweight/obesity related to undesirable food choices as evidenced by high carb intake, high calories    Patient Stated Health/Nutrition Goals:  Meet nutritional needs     Identified treatment goals: Not applicable    Nutrition Interventions (ADVISE/ASSIST):  Change up breakfast: goal of 20 grams of protein, 1 cup of fruit  2 eggs, 1 oz cheese  2 eggs, 1/2 cup beans  1 cup plain [place] yogurt, 2 Tbsp seeds or 1/4 cup nuts  Limit juices: can have just the fruit but avoid the added sugars  Walking 4-5 days a week for 30 minutes (can do 15 minutes twice a day)    ______________________________________________________________________  Referring MD or Clinic:   Hayden Pedro, *  Badal, Chriss Czar, MD    Reason for Referral:   1. BMI 38.0-38.9,adult        Medical History:  Past Medical History:   Diagnosis Date    Abdominal pain during pregnancy in second trimester 09/27/2021    Abnormal laboratory test 07/29/2021    Formatting of this note might be different from the original. Add free T4, T3 to 07/26/21 lab--repeat TSH and free T4, T3 on  08/23/21=TSH wnl, free T4 wnl, T3 low (13) Elevated WBC (12.2) on 07/26/21--repeat CBC ~08/09/21 WBC 08/23/21=11 no further f/u needed 09/28/21 TSH=wnl, free T3=wnl    Anemia     Anemia affecting pregnancy in third trimester 06/06/2019    On oral iron.  [ ]  hgb 07/18/19 =    Biliary colic 09/27/2021    Cholelithiasis 04/15/2022    Depression     Encounter for procreative genetic counseling 07/27/2021    Genetic counseling visit on 08/03/21 with Modena Slater, MS, CGC  Indication: aneup screening     Primary OB / Referring provider:   [place] [facility] Provider outside [facility] - fax note & results     Prenatal screening and diagnostic tests:   [facility] NIPS results:      Lab Results  Component  Value  Date     Genetic analysis  Negative  08/03/2021     Predicted Fetal Sex  Female  08/03/2021     Fetal Fr    Encounter for ultrasound to assess fetal weight in patient with previous large baby and history of cesarean section, antepartum 07/26/2021    Gallstones 10/28/2021    Formatting of this note might be different from the original. Recurrent biliary colic [ ]  pending surgical referral 10/2021 @ [facility] 11/22/21 apt Rescheduled to 12/22/21 with [facility] general bariatric surgery Pt called and rescheduled for 01/26/22 9:40--kept and gallbladder removal scheduled for 04/22/22    Joint pain 05/18/2022    Obesity affecting pregnancy 07/26/2021    Formatting of this note is different from the original. Recommendations [ ]  Aspirin 81 mg daily after 12 weeks;accepts; discontinue after 36 weeks [ ]  Nutrition consult [ ]  Weight gain 11-20 lbs for singleton and 25-35 lbs for twin pregnancy (IOM guidelines)  Higher class of obesity patients recommended to gain closer to lower limit  Weight loss is associated with adverse  outcomes [ ]  Baseline a    Pain of round ligament affecting pregnancy, antepartum 11/30/2021    Polyhydramnios affecting pregnancy in third trimester 06/26/2019    Mild polyhydramnios noted on [place] 06/26/19 (AFI 24.3cm at 31 wk); pt counseled at [facility]  06/26/19 BPP otherwise WNL  [x]  f/u US sched in 3wk at [facility]: done 07/17/19 No polyhydramnios/AFI WNL    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 07/26/21 TSH hi (4.720), T3 low (18), free T4 wnl Repeat TSH and free T4, T3 in 4 wks: 08/23/21=TSH wnl, free T4 wnl, T3 low (13) 09/28/21 TSH and free T3 wnl      Supervision of high risk pregnancy in first trimester 07/26/2021 Formatting of this note is different from the original. Nursing Staff Provider Office Location  [facility] Dating  [facility] = 02/07/22 Language  Spanish Anatomy US  Presentation- cephalic, placenta-anterior, EFW-40%, AFI- WNL, 3 vessel cord present   Flu Vaccine  08/23/2021 Genetic Screen  NIPS:  Done 08/03/21 = neg; AFP: 08/23/21= Negative Declines SMA/CF carrier testing TDaP vaccine   11/16/2021 Hgb A1C or G    Supervision of low-risk first pregnancy 02/18/2019    Formatting of this note might be different from the original.     Nursing Staff Provider   Office Location  [facility] Dating  09/01/19 by LMP & 12 6/7 u/s on 02/22/19   Language  Spanish Anatomy US  04/05/19=[facility], 06/26/19: cardiac exam limited, mild polyhydramnios (AFI 24.3cm), needs f/u in 3 wks   Flu Vaccine  Given 06/06/2019 Genetic Screen  Quad:NEG, genetic sonogram neg at 06/26/19 at 31 wk     TDaP vacc    UTI (urinary tract infection) in pregnancy, antepartum 02/14/2019    1. >100,000 E. Coli on 02/12/19  Macrobid 100 mg po BID x 7 days--given 02/15/19  [x]   TOC- at next RV (Sept) - no growth 03/13/19     2. 07/12/19: >100,000 Streptococcus mitis. Rx keflex 500mg  TID x7   [ ]  TOC--Did not go to lab for C&S 08/01/19  TOC 08/14/19=neg    Vaginal discharge 11/30/2021    Viral hepatitis A without hepatic coma 05/18/2022       Anthropometrics:    Present Weight   Current BMI     Goal Weight   200# Present Height       Weight History:  Wt Readings from Last 6 Encounters:   05/11/23 (!) 107.5 kg (237 lb)   04/10/23 (!) 106.1 kg (234 lb)   01/02/23 (!) 107 kg (236 lb)   10/10/22 (!) 108.4 kg (239 lb)   08/26/22 (!) 105.1 kg (231 lb 9.6 oz)   07/22/22 98.8 kg (217 lb 12.8 oz)       Medications, Herbs, Supplements:  Reviewed nutritionally relevant medications and supplements.    Recent Labs:   Hemoglobin A1C (%)   Date Value   04/15/2022 5.2      No components found for: LDLCALC   BP Readings from Last 3 Encounters:   05/11/23 103/71   04/10/23 121/87   01/02/23 117/72     Lab Results Component Value Date    CHOL 116 05/11/2023    CHOL 84 04/15/2022     Lab Results   Component Value Date    HDL 36 (L) 05/11/2023    HDL 37 (L) 04/15/2022     Lab Results   Component Value Date    LDL 58 05/11/2023  LDL 33 (L) 04/15/2022     Lab Results   Component Value Date    VLDL 21.8 05/11/2023    VLDL 13.6 04/15/2022     Lab Results   Component Value Date    CHOLHDLRATIO 3.2 05/11/2023    CHOLHDLRATIO 2.3 04/15/2022     Lab Results   Component Value Date    TRIG 109 05/11/2023    TRIG 68 04/15/2022         Physical Activity:  JYNWGNF-6 times per week for 40-60 minutes    Dietary Restrictions, Food Intolerances:  Coffee  Whole milk    Other GI Issues:  Chewing/Swallowing Difficulties: None  Heartburn/Reflux? A little, depending on what she eats  Diarrhea/Constipation: Sometimes diarrhea, might related to gall bladder removal  Abdominal Pain/Bloating: Some pain around where gall bladder was    Allergies:   No Known Allergies    24-Hour recall/usual intake:  1st None  2nd 7-8 PM: eggs, tomato, rice, beans, water  3rd eggs, chicken, beans  Snacks None    Other Usual Intake:  1st 10 AM: something light, eggs, fried beans, salad, green shakes/shakes  2nd 2 PM: meat/protein, starch  3rd 7-8 PM: similar to lunch, avoids gluten  Snacks bread with peanut butter  Beverages: water, sometimes soda, juices-water/fruit/sugar  Eating out: on weekends  Cooking: Self  Grocery shopping: Self, partner  Grocery shops at: [employer], [employer], [employer], [place]   Receive Food benefits: None  Working Emergency planning/management officer, Engineer, maintenance (IT) available? Yes    Behavioral Risk Factors:  Emotional eating- No  Skipping meals- Sometimes  Grazing- yes if a craving  Night eating- sometimes  Fast Eating  no  Overeating  yes    Hunger and Satiety Issues:  Appetite: Normal    Patient Questions:  Lose weight?   Trouble with RA  Bariatric surgery    Estimated Needs:  Estimated Energy Needs: 1900-2200 kcal/day    Estimated Protein Needs: 80-90 gm/day    Materials Provided To Meet their identified goals:  List of recommendations    Food labels discussed: Yes    Expected Compliance/Barriers are:   Comprehension of plan good  Readiness for change good  Ability to meet goals good    We assessed family/social/cultural characteristics and these were relevant to care: none evident    Patient has auditory or visual communication barrier or need: no    Interventions Codes:  General/healthful diet     Follow-up (ARRANGE):  4 weeks      Length of visit was 40 minutes. Patient understands diagnosis and treatment plan. Patient voices understanding. Plan is consistent with the patient???s preferences and goals.

## 2023-06-02 NOTE — Unmapped (Signed)
Patient Stated Health/Nutrition Goals:  Meet nutritional needs     Identified treatment goals: Not applicable    Nutrition Interventions (ADVISE/ASSIST):  Change up breakfast: goal of 20 grams of protein, 1 cup of fruit  2 eggs, 1 oz cheese  2 eggs, 1/2 cup beans  1 cup plain [place] yogurt, 2 Tbsp seeds or 1/4 cup nuts  Limit juices: can have just the fruit but avoid the added sugars  Walking 4-5 days a week for 30 minutes (can do 15 minutes twice a day)

## 2023-06-09 NOTE — Unmapped (Addendum)
The [facility] has made a second and final attempt to reach this patient to refill the following medication:HUMIRA(CF) PEN 40 mg/0.4 mL injection (adalimumab).      We have left voicemails on the following phone numbers: [phone], have sent a MyChart message, have sent a text message to the following phone numbers: [phone], and have sent a Mychart questionnaire..    Dates contacted: 12/5 -12/20  Last scheduled delivery: 05/03/23    The patient may be at risk of non-compliance with this medication. The patient should call the [facility] at [phone]  Option 4, then Option 2: Dermatology, Gastroenterology, Rheumatology to refill medication.    Art therapist

## 2023-06-28 NOTE — Unmapped (Signed)
[facility] Delivery [facility] Refill Coordination Note    Specialty Medication(s) to be Shipped:   Inflammatory Disorders: Humira    Other medication(s) to be shipped: No additional medications requested for fill at this time     Marie Cole, DOB: 10/09/2000  Phone: [phone] (home)       All above HIPAA information was verified with patient.     Was a Nurse, learning disability used for this call? No    Completed refill call assessment today to schedule patient's medication shipment from the [facility]  [phone]).  All relevant notes have been reviewed.     Specialty medication(s) and dose(s) confirmed: Regimen is correct and unchanged.   Changes to medications: Nazanin reports no changes at this time.  Changes to insurance: No  New side effects reported not previously addressed with a pharmacist or physician: None reported  Questions for the pharmacist: No    Confirmed patient received a Conservation officer, historic buildings and a Surveyor, mining with first shipment. The patient will receive a drug information handout for each medication shipped and additional FDA Medication Guides as required.       DISEASE/MEDICATION-SPECIFIC INFORMATION        For patients on injectable medications: Patient currently has 0 doses left.  Next injection is scheduled for overdue, will take 1/9.    SPECIALTY MEDICATION ADHERENCE     Medication Adherence    Patient reported X missed doses in the last month: 0  Specialty Medication: HUMIRA(CF) PEN 40 mg/0.4 mL injection (adalimumab)  Patient is on additional specialty medications: No              Were doses missed due to medication being on hold? No    Humira 40/0.4 mg/ml: 0 doses of medicine on hand        REFERRAL TO PHARMACIST     Referral to the pharmacist: Not needed      [facility]     Shipping address confirmed in Epic.       Delivery Scheduled: Yes, Expected medication delivery date: 06/29/23.     Medication will be delivered via Same Day Courier to the prescription address in Epic [place].    Willette Pa   [facility]  Specialty Technician

## 2023-06-29 NOTE — Unmapped (Signed)
Marie Cole July 's HUMIRA(CF) PEN 40 mg/0.4 mL injection (adalimumab) shipment will be delayed as a result of no credit card on file to collect copayment.     I have reached out to the patient  at [phone]  and communicated the delay. We will wait for a call back from the patient to reschedule the delivery.  We have not confirmed the new delivery date.

## 2023-06-30 NOTE — Unmapped (Signed)
Marie Cole July 's Humira shipment will be rescheduled as a result of no credit card on file to collect copayment.     I have spoken with the patient  via incoming phone call and communicated the delivery change. We will reschedule the medication for the delivery date that the patient agreed upon.  We have confirmed the delivery date as 07/03/23, via same day courier.

## 2023-07-03 MED FILL — HUMIRA PEN CITRATE FREE 40 MG/0.4 ML: SUBCUTANEOUS | 28 days supply | Qty: 2 | Fill #7

## 2023-08-08 NOTE — Unmapped (Signed)
 [facility] and Home Delivery Pharmacy Refill Coordination Note    Specialty Medication(s) to be Shipped:   Inflammatory Disorders: Humira    Other medication(s) to be shipped: No additional medications requested for fill at this time     Marie Cole, DOB: 17-Jun-2001  Phone: [phone] (home)       All above HIPAA information was verified with patient.     Was a Nurse, learning disability used for this call? No    Completed refill call assessment today to schedule patient's medication shipment from the [facility]  [phone]).  All relevant notes have been reviewed.     Specialty medication(s) and dose(s) confirmed: Regimen is correct and unchanged.   Changes to medications: Marie Cole reports no changes at this time.  Changes to insurance: No  New side effects reported not previously addressed with a pharmacist or physician: None reported  Questions for the pharmacist: No    Confirmed patient received a Conservation officer, historic buildings and a Surveyor, mining with first shipment. The patient will receive a drug information handout for each medication shipped and additional FDA Medication Guides as required.       DISEASE/MEDICATION-SPECIFIC INFORMATION        For patients on injectable medications: Patient currently has 0 doses left.  Next injection is scheduled for 08/10/2023.    SPECIALTY MEDICATION ADHERENCE     Medication Adherence    Patient reported X missed doses in the last month: 0  Specialty Medication: HUMIRA(CF) PEN 40 mg/0.4 mL injection (adalimumab)  Patient is on additional specialty medications: No              Were doses missed due to medication being on hold? No    Humira 40/0.4 mg/ml: 0 days of medicine on hand       REFERRAL TO PHARMACIST     Referral to the pharmacist: Not needed      [facility]     Shipping address confirmed in Epic.       Delivery Scheduled: Yes, Expected medication delivery date: 08/10/2023.     Medication will be delivered via Same Day Courier to the prescription address in Epic [place].    Quintella Reichert   [facility]  Specialty Technician

## 2023-08-10 NOTE — Unmapped (Signed)
 Marie Cole July 's humira shipment will be rescheduled as a result of inclement weather.     I have spoken with the patient via interpreter via incoming phone call and communicated the delivery change. We will reschedule the medication for the delivery date that the patient agreed upon.  We have confirmed the delivery date as 2/21, via same day courier.

## 2023-08-11 MED FILL — HUMIRA PEN CITRATE FREE 40 MG/0.4 ML: SUBCUTANEOUS | 28 days supply | Qty: 2 | Fill #8

## 2023-09-03 NOTE — Unmapped (Signed)
 RHEUMATOLOGY CLINIC FOLLOW-UP NOTE      Assessment/Plan:      Marie Cole is a 23 y.o. female with a history of iron deficiency anemia, subclinical hypothyroidism, GERD, and currently breast feeding who presents for follow up of seropositive rheumatoid arthritis.     Seropositive Rheumatoid arthritis:   Positive RF 397.3 and positive anti-CCP antibody > 340. Xray hands 11/0/23 without erosive disease. Joint pain and inflammation improved with initiation of humira. Only endorsing shoulder pain today related to mechanical injury.  - continue Humira 40 mg every 14 days   - will defer starting methotrexate since patient still breast feeding; consider in the future    High risk medication use   - Patient is currently taking Humira, immunosuppressant medications that require intensive monitoring. This monitoring was done today through history, physical, and/or lab testing.   - Reviewed CBC, BUN, Cr, albumin, AST, and ALT from 05/11/2023 with mild LFT elevation  - repeat medication monitoring labs today.     Elevated liver function tests:  She has a past liver injury related to her gallbladder in 2023, which normalized but showed elevated liver enzymes after starting Humira.   -Repeat comprehensive metabolic panel today  -Ordered right upper quadrant ultrasound    Obesity:  -Patient requesting referral for bariatric surgery evaluation so ordered consult    Health Maintenance  - PCV-20: Up to date   - Annual flu vaccination: Up to date   - COVID Vaccination: Due, patient declined vaccination   - Contraception: not taking teratogenic medication      Return in about 6 months (around 03/06/2024).    This patient was seen and discussed with Dr. Scarlette Calico who agrees with the plan outlined above.    Nathen May, DO  Rheumatology Fellow, PGY-5    Subjective:   Primary Care Provider: Aaron Mose, MD    History of Present Illness  Marie Cole is a 23 y.o. female with a PMH of iron deficiency anemia, subclinical hypothyroidism, and GERD follow-up of seropositive rheumatoid arthritis.      Last clinic visit April 10, 2023 her joint pain and swelling are improving with initiation of Humira.  On her monitoring labs her AST was 61 and ALT was 74 which is elevated from her baseline.  Repeat in May 11, 2023 with AST elevated to 38 ALT 62 and alk phos 167.    She has been experiencing tremors in her fingers for the past two weeks. Initially, one finger began trembling, followed by another finger a few days later. The tremors occur suddenly, last about thirty seconds, and happen approximately four times a day. They are not associated with reaching or pointing, and there is no numbness or burning pain in the area. Caffeine intake does not affect the tremors. She also mentions some weakness in her hand, describing an incident where she lost grip strength while holding her baby. This was the first occurrence of such weakness since her arthritis began.    Regarding her rheumatoid arthritis, she experiences joint pain primarily during exercise or strenuous activities. She is currently on Humira injections every fourteen days, which she has been using to manage her condition. She reports no significant joint pain, stiffness, or swelling upon waking in the morning.    She has a past liver injury related to her gallbladder in 2023, which normalized but showed elevated liver enzymes after starting Humira. She is concerned about the potential impact of her medication on her liver health.  She has a history of low iron levels, for which she was advised to take iron supplements and prenatal vitamins. Despite this, her iron levels remained low. She was previously informed that her iron levels were low and that she might need an intravenous treatment, but she is unsure of the specifics.      Disease History:  Started having joint pain during a  hospitalization 04/15/22 for acute liver injury from cholelithiasis sp cholecystectomy on 10/29. At the time attributed to possible reactive viral arthritis that she was positive for rhinovirus and enterovirus.  But pain persisted and she was having trouble performing ADLs and taking care of her new baby.  Her pain involves multiple sites including hands, feet, knees and shoulders.  Found to have positive RF 397.3 and positive anti-CCP antibody > 340 also ANA +1: 80. Established care with [facility] 06/11/23. Started plaquenil since safe in breastfeeding. Seen in clinic 10/10/22 started on humira since she was breast feeding.     Prior treatments:  - hydroxychloroquine -- discontinued due to patient reported fatigue, chest pressure, and redness in her face  - humira started 10/10/2022    PMH, PFH, PSH as previously documented. Changes addressed in HPI    Medications were reviewed this visit.  Current Outpatient Medications on File Prior to Visit   Medication Sig    ADALIMUMAB PEN CITRATE FREE 40 MG/0.4 ML Inject the contents of 1 pen (40 mg total) under the skin every fourteen (14) days.    famotidine (PEPCID) 20 MG tablet Take 1 tablet (20 mg total) by mouth two (2) times a day as needed for heartburn. (Patient not taking: Reported on 07/22/2022)    multivitamin, prenatal, folic acid-iron, 27-1 mg Tab Take 1 tablet by mouth daily.    norgestimate-ethinyl estradiol (ORTHO TRI-CYCLEN, 28,) 0.18/0.215/0.25 mg-35 mcg (28) per tablet Take 1 tablet by mouth daily. (Patient not taking: Reported on 04/10/2023)     No current facility-administered medications on file prior to visit.       Immunization were reviewed this visit.  Immunization History   Administered Date(s) Administered    COVID-19 VACCINE,MRNA(MODERNA)(PF) 09/18/2020, 11/20/2020    HEPATITIS B VACCINE ADULT, ADJUVANTED, IM(HEPLISAV B) 05/11/2023    Human Pappilomavirus Vaccine,9-Valent(PF) 05/11/2023    INFLUENZA VACCINE IIV3(IM)(PF)6 MOS UP 04/10/2023    INFLUENZA VACCINE QUAD (IIV4 PF) 25MO+ INJECTABLE  06/06/2019, 08/23/2021 Influenza Vaccine Quad(IM)6 MO-Adult(PF) 06/06/2019, 08/23/2021    Influenza Virus Vaccine, unspecified formulation 06/17/2019, 08/23/2021    MMR 08/31/2019, 10/14/2019    Pneumococcal Conjugate 20-valent 07/22/2022    TdaP 06/06/2019, 11/16/2021       Objective:     Vitals:    09/04/23 1517   BP: 112/74   BP Site: L Arm   BP Position: Sitting   Pulse: 80   Temp: 36.3 ??C (97.3 ??F)   TempSrc: Temporal   Weight: (!) 109.7 kg (241 lb 12.8 oz)           Body mass index is 40.24 kg/m??.    Physical Exam  Gen: no acute distress.  Well nourished and well kempt.  Head: No alopecia. [place]/AT.  Eyes: EOMI. PERRL. No conjunctival injection. No scleral icterus. Adequate tear meniscus.   Ears: Normal external exam. Normal TM's.  Nose/Oral:  No eyrthema in the nares or nasal polyps.  Clear oropharynx. No oral ulcers. Adequate sublingual salivary pooling.   Neck:  Supple, no JVD.  No thyromegaly.  No carotid bruit.   Lymph: No lymphadenopathy of cervical, supraclavicular,  axillary or inguinal areas.  Chest: No increased work of breathing. CTAB.  Cardiovascular: RRR, normal s1/s2, no murmurs, rubs, or gallops appreciated.  2+ distal pulses equal bilaterally.  MSK:     Hands: Non-tender. No swelling/synovitis. Full ROM. Able to make fist.    Wrists: Non-tender. No swelling/synovitis. Full ROM.    Elbows: Non-tender. No swelling/synovitis. Full ROM.    Shoulders: Tenderness bilateral shoulders. Full ROM.    Neck: Non-tender to mid-line palpation. Full ROM.    Back: Non-tender to mid-line palpation & SI joints. No deformities. Full ROM.    Hips: Non-tender. Full ROM.    Knees: Non-tender. No swelling/synovitis. Full ROM.    Ankles: Non-tender. No swelling/synovitis. Full ROM.    Feet: Non-tender. No swelling/synovitis. Full ROM.   Neuro:  AAO x 3.  Able to move all extremities.   Skin:  No rashes, nodules or dry skin noted.    Psych: Normal mood and affect. Normal thought process.    Swollen Joint Count (0-28): 0  Tender Joint Count (0-28): 2  Patient Global Assessment of Disease Activity (0-10): 0  Evaluator Global Assessment of Disease (0-10): 0  CDAI Score: 2  CDAI interpretation:  0.0-2.8 Remission   2.9-10.0 Low disease activity   10.1-22.0 Moderate disease activity   22.1-76.0 High disease activity       Test Results  Office Visit on 09/04/2023   Component Date Value    Sodium 09/04/2023 140     Potassium 09/04/2023 3.9     Chloride 09/04/2023 102     CO2 09/04/2023 25.9     Anion Gap 09/04/2023 12     BUN 09/04/2023 9     Creatinine 09/04/2023 0.54 (L)     BUN/Creatinine Ratio 09/04/2023 17     eGFR CKD-EPI (2021) Fema* 09/04/2023 >90     Glucose 09/04/2023 91     Calcium 09/04/2023 9.9     Albumin 09/04/2023 4.0     Total Protein 09/04/2023 8.1     Total Bilirubin 09/04/2023 0.3     AST 09/04/2023 32     ALT 09/04/2023 44     Alkaline Phosphatase 09/04/2023 162 (H)     WBC 09/04/2023 11.8 (H)     RBC 09/04/2023 4.47     HGB 09/04/2023 13.2     HCT 09/04/2023 38.9     MCV 09/04/2023 87.0     MCH 09/04/2023 29.4     MCHC 09/04/2023 33.8     RDW 09/04/2023 14.2     MPV 09/04/2023 8.5     Platelet 09/04/2023 324     Neutrophils % 09/04/2023 53.5     Lymphocytes % 09/04/2023 38.5     Monocytes % 09/04/2023 5.9     Eosinophils % 09/04/2023 1.7     Basophils % 09/04/2023 0.4     Absolute Neutrophils 09/04/2023 6.3     Absolute Lymphocytes 09/04/2023 4.5 (H)     Absolute Monocytes 09/04/2023 0.7     Absolute Eosinophils 09/04/2023 0.2     Absolute Basophils 09/04/2023 0.1

## 2023-09-04 ENCOUNTER — Ambulatory Visit
Admit: 2023-09-04 | Discharge: 2023-09-04 | Payer: PRIVATE HEALTH INSURANCE | Attending: Student in an Organized Health Care Education/Training Program | Primary: Student in an Organized Health Care Education/Training Program

## 2023-09-04 ENCOUNTER — Ambulatory Visit: Admit: 2023-09-04 | Discharge: 2023-09-04 | Payer: PRIVATE HEALTH INSURANCE

## 2023-09-04 DIAGNOSIS — M0579 Rheumatoid arthritis with rheumatoid factor of multiple sites without organ or systems involvement: Principal | ICD-10-CM

## 2023-09-04 DIAGNOSIS — R748 Abnormal levels of other serum enzymes: Principal | ICD-10-CM

## 2023-09-04 DIAGNOSIS — Z6838 Body mass index (BMI) 38.0-38.9, adult: Principal | ICD-10-CM

## 2023-09-04 DIAGNOSIS — Z79899 Other long term (current) drug therapy: Principal | ICD-10-CM

## 2023-09-04 LAB — CBC W/ AUTO DIFF
BASOPHILS ABSOLUTE COUNT: 0.1 10*9/L (ref 0.0–0.1)
BASOPHILS RELATIVE PERCENT: 0.4 %
EOSINOPHILS ABSOLUTE COUNT: 0.2 10*9/L (ref 0.0–0.5)
EOSINOPHILS RELATIVE PERCENT: 1.7 %
HEMATOCRIT: 38.9 % (ref 34.0–44.0)
HEMOGLOBIN: 13.2 g/dL (ref 11.3–14.9)
LYMPHOCYTES ABSOLUTE COUNT: 4.5 10*9/L — ABNORMAL HIGH (ref 1.1–3.6)
LYMPHOCYTES RELATIVE PERCENT: 38.5 %
MEAN CORPUSCULAR HEMOGLOBIN CONC: 33.8 g/dL (ref 32.0–36.0)
MEAN CORPUSCULAR HEMOGLOBIN: 29.4 pg (ref 25.9–32.4)
MEAN CORPUSCULAR VOLUME: 87 fL (ref 77.6–95.7)
MEAN PLATELET VOLUME: 8.5 fL (ref 6.8–10.7)
MONOCYTES ABSOLUTE COUNT: 0.7 10*9/L (ref 0.3–0.8)
MONOCYTES RELATIVE PERCENT: 5.9 %
NEUTROPHILS ABSOLUTE COUNT: 6.3 10*9/L (ref 1.8–7.8)
NEUTROPHILS RELATIVE PERCENT: 53.5 %
PLATELET COUNT: 324 10*9/L (ref 150–450)
RED BLOOD CELL COUNT: 4.47 10*12/L (ref 3.95–5.13)
RED CELL DISTRIBUTION WIDTH: 14.2 % (ref 12.2–15.2)
WBC ADJUSTED: 11.8 10*9/L — ABNORMAL HIGH (ref 3.6–11.2)

## 2023-09-04 LAB — COMPREHENSIVE METABOLIC PANEL
ALBUMIN: 4 g/dL (ref 3.4–5.0)
ALKALINE PHOSPHATASE: 162 U/L — ABNORMAL HIGH (ref 46–116)
ALT (SGPT): 44 U/L (ref 10–49)
ANION GAP: 12 mmol/L (ref 5–14)
AST (SGOT): 32 U/L (ref <=34)
BILIRUBIN TOTAL: 0.3 mg/dL (ref 0.3–1.2)
BLOOD UREA NITROGEN: 9 mg/dL (ref 9–23)
BUN / CREAT RATIO: 17
CALCIUM: 9.9 mg/dL (ref 8.7–10.4)
CHLORIDE: 102 mmol/L (ref 98–107)
CO2: 25.9 mmol/L (ref 20.0–31.0)
CREATININE: 0.54 mg/dL — ABNORMAL LOW (ref 0.55–1.02)
EGFR CKD-EPI (2021) FEMALE: >90 mL/min/{1.73_m2} (ref >=60)
GLUCOSE RANDOM: 91 mg/dL (ref 70–99)
POTASSIUM: 3.9 mmol/L (ref 3.4–4.8)
PROTEIN TOTAL: 8.1 g/dL (ref 5.7–8.2)
SODIUM: 140 mmol/L (ref 135–145)

## 2023-09-04 NOTE — Unmapped (Addendum)
 Your Rheumatologist today was Dr. Nathen May     Summary of Plan for 09/04/23    Today we discussed the following:    Start Ferrous Sulfate 325 mg of iron over the counter daily.   Referral to Bariatric Surgery to discuss weight loss surgery.   Continue Humira injections every 14 days.    Repeat monitoring labs today   Ordered an ultrasound of your liver. Please call Radiology at [phone] to schedule your study. They may not call you.   Follow up in 6 months       When should I expect results?   Please note that it may take up to 21 days for results to return, but some may be completed within 24 hours. Please wait for me to respond to your results before sending your questions. If you have not been notified by phone, mail or [facility] (if applicable) in 21 days, please contact our office at [phone] or via [facility] messaging [email or web address])     What do I do if I have questions or concerns after the visit?   If you have non-urgent questions, the best way to contact your provider is to send a MyChart message by visiting [email or web address].  You can also use MyChart to request refills and access test results.  Providers will do their best to respond within 2-3 business days, although it may take longer in some circumstances.  If you have immediate concerns, please contact our clinic by phone  at [phone].     Need assistance scheduling appointments?   Call [facility] at [phone] for appointment assistance. If you are an established patient you can manage your appointments through [facility].     Thank you for allowing [facility] to be involved in your care!     [facility]  [address]   [place], [place] [address]  Phone:  [phone]  Fax:  [phone]

## 2023-09-07 NOTE — Unmapped (Signed)
 Weight Management Services Financial Information Form    Hospital NPI: [id number] / Physicians NPI: [id number]   Ferd Glassing NPI: [id number]   [place] NPI: [id number]  Yetta Flock NPI: [id number]    MRN: [id number]  Patient: Marie Cole    Insurance Company: Medicaid Managed Plan: [facility]  Telephone:  Policy/Subscriber ID: [id number]     Subscriber Information (if different from patient)  Name: n/a  Date of Birth:  n/a     Is insurance in-network: Yes.     Is Bariatric Surgery Covered: Yes  Covered Surgeries:   43644: Laparoscopic roux-en-y gastric bypass   43775: Laparoscopic gastric sleeve     **Completion of this form does not guarantee coverage and is subject to change. The patient is instructed at the time of scheduling that they need to call their insurance company directly to confirm bariatric surgery coverage benefits. If the patient has questions about coinsurance, deductibles or out-of-pocket expenses they should call their insurance carrier or the financial counselor for upfront surgery cost questions at [phone]**    What requirements must be met for surgery to be approved:  Diagnoses of Morbid Obesity With at least 1 of the following Diabetes,Heath Disease, Arteriosclerosis, Limitation of motion of any weight baring joints, Respiratory issues, Hypoxia at rest, Circulatory Issues, Coronary artery disease, Hypertension, Cholesterol Greater then 28.     Physician Supervised Diet? Yes How Long? 3 months  Does it have to be consecutive? Yes  Does it have to be done by PCP or can it be by a dietitian?  Either (RD if physician supervised)  How recent does it have to be? (past , , etc.) 1 year (but 3 consecutive months within year)  Do I need a weight history? (59yr, 41yrs, 35yrs) Yes  Do I need a clearance/referral letter from a doctor? Yes   If yes, does it have to come from PCP? Yes  Are nutrition consultations a covered benefit? Yes  (List next to visit type max number of visits, if applicable)  51884: Initial 1:1 nutrition counseling   [phone]: Follow up 1:1 nutrition counseling   97804: Group MNT nutrition counseling

## 2023-09-08 ENCOUNTER — Inpatient Hospital Stay: Admit: 2023-09-08 | Discharge: 2023-09-09 | Payer: PRIVATE HEALTH INSURANCE

## 2023-09-11 DIAGNOSIS — M069 Rheumatoid arthritis, unspecified: Principal | ICD-10-CM

## 2023-09-11 DIAGNOSIS — M2550 Pain in unspecified joint: Principal | ICD-10-CM

## 2023-09-11 DIAGNOSIS — M0579 Rheumatoid arthritis with rheumatoid factor of multiple sites without organ or systems involvement: Principal | ICD-10-CM

## 2023-09-11 MED ORDER — DICLOFENAC 1 % TOPICAL GEL
Freq: Four times a day (QID) | TOPICAL | 17 refills | 19 days | PRN
Start: 2023-09-11 — End: 2024-09-10

## 2023-09-11 NOTE — Unmapped (Signed)
 Requested Renewals     Name from pharmacy: DICLOFENAC SODIUM 1% GEL         Will file in chart as: diclofenac sodium (VOLTAREN) 1 % gel    The original prescription was discontinued on 05/11/2023 by Aaron Mose, MD for the following reason: No Longer Taking. Renewing this prescription may not be appropriate.    Sig: Apply 4 g topically four (4) times a day as needed for arthritis or pain.    Disp: 300 g    Refills: 17    Start: 09/11/2023    Class: Normal    For: Arthralgia, unspecified joint; Rheumatoid arthritis flare (CMS-HCC); Rheumatoid arthritis involving multiple sites with positive rheumatoid factor (CMS-HCC)    Last ordered: 1 year ago (05/19/2022) by Aaron Mose, MD    Last refill: 05/19/2022    Rx #: [id number]       To be filled at: [id number] - [place], [place] - [address]

## 2023-09-15 NOTE — Unmapped (Signed)
 [facility] Refill Coordination Note    Specialty Medication(s) to be Shipped:   Inflammatory Disorders: Humira    Other medication(s) to be shipped: No additional medications requested for fill at this time     Marie Cole, DOB: 2000-12-19  Phone: There are no phone numbers on file.      All above HIPAA information was verified with patient.     Was a Nurse, learning disability used for this call? Yes, [id number] spanish. Patient language is appropriate in [facility]    Completed refill call assessment today to schedule patient's medication shipment from the [facility]  [phone]).  All relevant notes have been reviewed.     Specialty medication(s) and dose(s) confirmed: Regimen is correct and unchanged.   Changes to medications: Marie Cole reports no changes at this time.  Changes to insurance: No  New side effects reported not previously addressed with a pharmacist or physician: None reported  Questions for the pharmacist: No    Confirmed patient received a Conservation officer, historic buildings and a Surveyor, mining with first shipment. The patient will receive a drug information handout for each medication shipped and additional FDA Medication Guides as required.       DISEASE/MEDICATION-SPECIFIC INFORMATION        For patients on injectable medications: Patient currently has 0 doses left.  Next injection is scheduled for 09/18/23.    SPECIALTY MEDICATION ADHERENCE     Medication Adherence    Patient reported X missed doses in the last month: 0  Specialty Medication: HUMIRA(CF) PEN 40 mg/0.4 mL injection (adalimumab)  Patient is on additional specialty medications: No              Were doses missed due to medication being on hold? No    HUMIRA(CF) PEN 40 mg/0.4 mL injection (adalimumab): 0 doses of medicine on hand       REFERRAL TO PHARMACIST     Referral to the pharmacist: Not needed      [name]     Shipping address confirmed in Epic.     Cost and Payment: Patient has a copay of $4. They are aware and have authorized the pharmacy to charge the credit card on file.    Delivery Scheduled: Yes, Expected medication delivery date: 09/18/23.     Medication will be delivered via Same Day Courier to the prescription address in Epic WAM.    Marie Cole   [facility]  Specialty Technician

## 2023-09-18 ENCOUNTER — Ambulatory Visit: Admitting: Nurse Practitioner

## 2023-09-18 ENCOUNTER — Encounter: Payer: Self-pay | Admitting: Nurse Practitioner

## 2023-09-18 VITALS — BP 131/85 | HR 69 | Ht 65.0 in | Wt 239.2 lb

## 2023-09-18 DIAGNOSIS — Z3202 Encounter for pregnancy test, result negative: Secondary | ICD-10-CM | POA: Diagnosis not present

## 2023-09-18 DIAGNOSIS — Z30013 Encounter for initial prescription of injectable contraceptive: Secondary | ICD-10-CM | POA: Diagnosis not present

## 2023-09-18 DIAGNOSIS — Z309 Encounter for contraceptive management, unspecified: Secondary | ICD-10-CM

## 2023-09-18 DIAGNOSIS — Z3009 Encounter for other general counseling and advice on contraception: Secondary | ICD-10-CM

## 2023-09-18 LAB — PREGNANCY, URINE: Preg Test, Ur: NEGATIVE

## 2023-09-18 MED ORDER — MEDROXYPROGESTERONE ACETATE 150 MG/ML IM SUSP
150.0000 mg | INTRAMUSCULAR | Status: AC
  Administered 2023-09-18 – 2023-12-05 (×2): 150 mg via INTRAMUSCULAR

## 2023-09-18 MED FILL — HUMIRA PEN CITRATE FREE 40 MG/0.4 ML: SUBCUTANEOUS | 28 days supply | Qty: 2 | Fill #9

## 2023-09-18 NOTE — Progress Notes (Signed)
 Pt here for an acute family planning visit.  Wants to change from OCPs to Depo Provera.  Urine pregnancy test negative.  Depo Provera 150mg  IM given without difficulty.  Consent form signed.  Reminder card given for next injection.  Condoms declined.Collins Scotland, RN.

## 2023-09-21 ENCOUNTER — Ambulatory Visit: Admit: 2023-09-21 | Discharge: 2023-09-22

## 2023-09-21 ENCOUNTER — Ambulatory Visit: Admit: 2023-09-21 | Discharge: 2023-09-22 | Attending: Physician Assistant | Primary: Physician Assistant

## 2023-09-21 DIAGNOSIS — D509 Iron deficiency anemia, unspecified: Principal | ICD-10-CM

## 2023-09-21 DIAGNOSIS — Z6841 Body Mass Index (BMI) 40.0 and over, adult: Principal | ICD-10-CM

## 2023-09-21 DIAGNOSIS — Z6838 Body mass index (BMI) 38.0-38.9, adult: Principal | ICD-10-CM

## 2023-09-21 DIAGNOSIS — E66813 Class 3 severe obesity with serious comorbidity and body mass index (BMI) of 40.0 to 44.9 in adult, unspecified obesity type: Principal | ICD-10-CM

## 2023-09-21 DIAGNOSIS — Z0389 Encounter for observation for other suspected diseases and conditions ruled out: Principal | ICD-10-CM

## 2023-09-21 LAB — LIPID PANEL
CHOLESTEROL/HDL RATIO SCREEN: 3.4 (ref 1.0–4.5)
CHOLESTEROL: 144 mg/dL (ref <=200)
HDL CHOLESTEROL: 42 mg/dL (ref 40–60)
LDL CHOLESTEROL CALCULATED: 83 mg/dL (ref 40–100)
NON-HDL CHOLESTEROL: 102 mg/dL (ref 70–130)
TRIGLYCERIDES: 96 mg/dL (ref 0–150)
VLDL CHOLESTEROL CAL: 19.2 mg/dL (ref 8–29)

## 2023-09-21 LAB — TOXICOLOGY SCREEN, URINE
AMPHETAMINE SCREEN URINE: NEGATIVE
BARBITURATE SCREEN URINE: NEGATIVE
BENZODIAZEPINE SCREEN, URINE: NEGATIVE
BUPRENORPHINE, URINE SCREEN: NEGATIVE
CANNABINOID SCREEN URINE: NEGATIVE
COCAINE(METAB.)SCREEN, URINE: NEGATIVE
FENTANYL SCREEN, URINE: NEGATIVE
METHADONE SCREEN, URINE: NEGATIVE
OPIATE SCREEN URINE: NEGATIVE
OXYCODONE SCREEN URINE: NEGATIVE
PCP SCREEN, URINE: NEGATIVE

## 2023-09-21 LAB — TSH: THYROID STIMULATING HORMONE: 2.03 u[IU]/mL (ref 0.550–4.780)

## 2023-09-21 LAB — IRON & TIBC
IRON SATURATION: 10 % — ABNORMAL LOW (ref 20–55)
IRON: 42 ug/dL — ABNORMAL LOW (ref 50–170)
TOTAL IRON BINDING CAPACITY: 401 ug/dL (ref 250–425)

## 2023-09-21 LAB — HEMOGLOBIN A1C
ESTIMATED AVERAGE GLUCOSE: 108 mg/dL
HEMOGLOBIN A1C: 5.4 % (ref 4.8–5.6)

## 2023-09-21 LAB — VITAMIN B12: VITAMIN B-12: 635 pg/mL (ref 211–911)

## 2023-09-21 LAB — FERRITIN: FERRITIN: 18.2 ng/mL (ref 7.3–270.7)

## 2023-09-21 LAB — FOLATE: FOLATE: 13.7 ng/mL

## 2023-09-21 NOTE — Unmapped (Signed)
 [facility] Gastrointestinal Surgery/Weight Management Program    Assessment     Marie Cole is a 23 y.o. female and Body mass index is 42.9 kg/m??..  She presents with obesity.  Her target weight is 135-140 lbs .  Her weight gain history is suggestive of a  post partum weight gain and retention.     Co-morbidities at this time include Rheumatoid arthritis, Iron Deficiency Anemia.  [place] Obesity Staging Score is 2      Plan    1. Based on my assessment of the possible etiologies for the patient's obesity, medical co-morbidities, and review of systems my recommendations include the following:     Diet quality:  Diet is inadequate in protein, inadequate in vegetables, moderate in processed foods and moderate in SSB.     Patient was encouraged to focus on consuming lean protein, a variety of non starchy vegetables and fruit with each meal and snacks. Wean off carbonated and sugar sweetened beverages. Avoid consuming processed and fast foods; and avoid refined carbohydrates ( such as white bread, biscuits, white pasta, white rice, grits, crackers).     Dietary patterns and associated behaviors:  Meal pattern is fairly consistent.     We discussed the importance of having a  consistent meal pattern with three meals per day and 1-3 snacks. Avoid skipping meals or grazing. Avoid going longer than 4 hours without eating.     Patient asked to schedule meals and snacks every 3 hours.     Recommended Premier Protein, Ensure Max, Boost Max and Fair Life Core shakes as a meal replacement to avoid skipping meals. Or choose a protein shake with less than 200 calories, at least 20 grams of protein, less than 10 grams of total carbohydrates and 5 or less of total fat.         Exercise: PA not adequate for age/risk factors      We discussed increasing exercise duration by a few minutes each week until you get to 30 minutes most days of the week.     Then add resistance training by alternating between upper and lower body resistance exercises using bands or free weights 2-3 times a week.     Sleep: STOP-BANG Score 1/8.   Good sleep hygiene. She feels rested most mornings.     Encouraged healthy sleep habits/sleep hygiene such as going to bed when sleepy, trying to get between 7-8 hours per night,  going to bed around the same time nightly and waking around the same time each morning.   Avoid screen time at least one hour before bed.  Avoid drinking at least 2 hours before bed. If your mouth is dry, try small sips to moisten your mouth.      Stress management: Stress level is currently high, feels that it is manageable, and no worrisome sx such as SI/HI, self harm, or worsening of previous psych diagnosis        We will discuss meditation for stress management in the future.     Weight gain causing medications:Depo Provera.      Anti Obesity Medications: A few anti-obesity medications may be used for this patient including metformin, low dose phentermine, topiramate, wellbutrin, GLP1 agonists or SGLT2 inhibitors pending insurance coverage.     Obesity Surgery: Patient is interested in surgery and does meet criteria for bariatric surgery.  Patient may move forward with bariatric intake appointment in the near future.       Labwork ordered:  HgbA1C, TSH,  Vitamin B12, Vitamin D, Folate, Ferritin, Iron Panel, Lipid panel, Tox screen and Nicotine screen.    __________________________________________________________________________________________________________________________    Subjective    Marie Cole was seen in consultation at the request of Ishizawar, Jaci Lazier,* for bariatric surgery and   comprehensive evaluation for the management of worsening obesity    CC/HPI:   Marie Cole is a 23 y.o. female and Body mass index is 42.9 kg/m??..    Motivations and goals  The following information was extracted from a questionnaire that was administered to the patient prior to setting up appointment.      09/19/2023 11:26 AM   Intake Questions   What is your main reason for obesity treatment? I am concered about my health   What treatments are you interested in pursuing? Weight loss surgery   Overall goal - A.) I would like to get down to _____ lbs. B.) I do not have a weight goal. 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 (lbs) 242   What was your lowest adult weight on a diet or weight loss program? (lbs) 140   What was your lowest usual lowest adult weight? 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   Did you notice that you gained weight during any of the following circumstances: working 2nd or 3rd shift, when you quit smoking, using some medications Despues de mis embarazos   How much total weight did you gain with all your pregnancies that you were unable to lose after giving birth? 100   Please name the diets that have worked for you in the past; how much weight you lost; how long did it take to come back? En 2019 pesaba 198 libras comenze dieta baje 48 libras, mantuve el peso en 2020 durante el embarazo subi a 238, en 2021 perdi 40 libras luego incremente a 240 con el segundo embarazo en 2023 y depsues de ahi no eh podido bajar de [employer] comments: since 67 or 23 years old. Came to [place] at age 40 gained more weight.   I have gained and lost weight many times throughout my life, I have lowered it on a diet, but after my diagnosis of rheumatoid arthritis it has become very difficult for me to lose weight.     Dieting History:      09/19/2023    11:26 AM   Diet History   Are you currently working with a Registered Dietitian? Yes   Breakfast Huevo con jam??n, frijoles. (Egg, ham and beans)   Morning Snack Caffe y [address] de [place]   Afternoon Snack Cafe   Dinner No cene   After [employer] Te de manzanilla (Camomile Tea)   Do you frequently feel hungry within 2 hours of having a regular size meal? Yes   How many times a week? Casi siempre   Do you eat at times when you are not hungry? Yes   How many times a week? 4  veces   Do you eat for comfort when you are stressed or emotional? No   Are there times when you eat and it feels like you can't stop yourself from eating? Yes   How many times a week? Unas dos veces   Do you try to manage your weight by vomiting, using laxatives, diuretics, or excessive exercise? No   Do you sometimes find food on your bed which you do not remember eating? No   Do you eat late at night? Yes   How many times a week? 4 veces   Please list foods that you eat frequently Arroz, carnes rojas y hamburguesas, alitas   Do you exercise regularly? Yes      Provider comments: Sometimes she does not feel full with eating so she will eat to become satisfied. She tries to drink a lot of water so she will not eat that much. Denies loss of control.     Typically, she has three meals per day and a fruit snack. Or bread with peanut butter for snack.     Beverages: Water, Fresh fruit juice, Caffe  Alcohol: denies  Eating out: one time per week.       Physical Activity:      09/19/2023    11:26 AM   Physical Activity History   Do you exercise regularly? Yes   Type of Exercise Caminar en la caminadora   How many minutes do you exercise? 30   How many times do you exercise in a week? 4   Which physical activity do you enjoy? Caminar   Are there certain actions or activities that you cannot do because of your weight? No puedo hacer ejercicios con pesas ni nada consecutivo por que me duelen las articulaciones. ( tengo artritis reumatoide).      Provider comments:    Sleep:      09/19/2023    11:26 AM   Sleep History   How many hours do you sleep at night on average? 8   What time do you fall asleep?  1:00 AM   What time do you wake up without needing to go back to sleep?  9:00 AM   How many times do you wake up per night? 0   What time do you get the last drink of the day?  1:00 AM   Have you been diagnosed with Obstructive Sleep Apnea (OSA)? No   STOP-BANG Assesment My BMI is >35   STOP-BANG Score 1        Stop-BANG Risk: 0-2 = Low Risk      Provider comments: She feels rested sometimes in the morning. Sometimes her baby waits her help. If her sleep is not disturbed, she sleeps well.   Denies snoring or witnessed apnea    Stress:      09/19/2023    11:26 AM   Stress History   On a scale of 1 to 10, how stressed were you in the past year? 8   What do you do to manage your stress? Limpiar      Provider comments:  Present strategies to cope with stress: Cleaning house, go to park, not to focus much on what is going on.   Effectiveness of these strategies:Good      Previous use of anti-obesity medications:       No data to display                   Current medications with  potential to cause weight gain:yes, Depo Provera    Mental Health History(emotional health):  Any thoughts about harming yourself  or engagement in any self harming behaviors e.g. cutting yourself in the past 18 months: no  Have you been to the ER or hospitalized for mental health reasons :no  Any alcohol or substance abuse, including prescription abuse : no  Any suicidal attempts in the past: no  Any recreational drug use in the past 12 months:no  Any CBD products: no  Any passive exposures to tobacco or marijuana smoke:no    ??  Patient Active Problem List   Diagnosis    Iron deficiency anemia    History of difficult pregnancy    History of cholecystectomy    Liver injury, sequela    Breastfeeding (infant)    Rheumatoid arthritis involving multiple sites with positive rheumatoid factor    Gastroesophageal reflux disease without esophagitis    BMI 38.0-38.9,adult       Current Outpatient Medications Medication Sig Dispense Refill    ADALIMUMAB PEN CITRATE FREE 40 MG/0.4 ML Inject the contents of 1 pen (40 mg total) under the skin every fourteen (14) days. 6 each 3    medroxyPROGESTERone (DEPO-PROVERA) 150 mg/mL injection Inject 1 mL (150 mg total) into the muscle.      famotidine (PEPCID) 20 MG tablet Take 1 tablet (20 mg total) by mouth two (2) times a day as needed for heartburn. (Patient not taking: Reported on 07/22/2022) 60 tablet 5     No current facility-administered medications for this visit.        No Known Allergies    family history is not on file.      reports that she has never smoked. She has never been exposed to tobacco smoke. She has never used smokeless tobacco. She reports that she does not currently use alcohol. She reports that she does not use drugs.     Past Surgical History:   Procedure Laterality Date    NECK SURGERY  2018    PR LAP,CHOLECYSTECTOMY N/A 04/17/2022    Procedure: LAPAROSCOPY, SURGICAL; CHOLECYSTECTOMY;  Surgeon: Suella Broad, MD;  Location: MAIN OR [facility];  Service: Trauma    superior cervical lipoma removal  2018    [place]        Patients previous medical records from [facility] reviewed and summarized in the Obesity Medicine Evaluation Summary          Relevant History:  Seizures:   no    Palpitations:  no  Hypertension:  no    Glaucoma:  no  Kidney Stones:no  Gout:     no    Gallbladder Disease: yes,       Gallbladder Surgery: yes  Pancreatitis: no     Gallstone Pancreatitis: no    Personal or family history of Medullary Thyroid Cancer: no    For Women:   Postmenopause: no   Irregular menstrual cycles: No  Excessive facial hair: No  Active Contraceptive use: yes, Depo Provera        ROS:  Chest Discomfort:  no  Headache:    no  Whooshing sound in ears:no   Blurry Vision:     yes, requires eye glasses  Musculoskeletal pain:  yes, all joints due to RA. Currently flares are calm.   Urinary incontinence:  no  Heartburn/Reflux:  yes, occasionally     Nausea: no   Vomiting:    no   Dysphagia:    no  Abdominal pain:   no   Diarrhea:    no   Constipation:    no         Effect on quality of life:  Activities limited by the weight: none    Other systems reviewed were negative        OBJECTIVE:    Vital Signs:  BP 112/72 (BP Site: L Arm, BP Position: Sitting, BP Cuff Size: Large)  - Pulse 76  - Temp 36.7 ??C (98.1 ??F) (Oral)  - Ht 160 cm (5' 3)  - Wt (!) 109.9 kg (242 lb 3.2 oz)  - LMP 09/14/2023 (Exact Date)  - SpO2 100%  - BMI 42.90 kg/m??    Wt Readings from Last 5 Encounters:   09/21/23 (!) 109.9 kg (242 lb 3.2 oz)   09/04/23 (!) 109.7 kg (241 lb 12.8 oz)   05/11/23 (!) 107.5 kg (237 lb)   04/10/23 (!) 106.1 kg (234 lb)   01/02/23 (!) 107 kg (236 lb)       Physical Exam:  Gen: alert and oriented, no acute distress  Body fat distribution Central adiposity       No supraclavicular adiposity.        No dorsal adiposity.    Waist circumference: Not done .    Neck circumference:  15 inches.    HEENT:      Ocular: non-icteric, non-injected, EOMI    Oral exam : Tongue moist, pink. noOP crowding.   NECK: full neck,   THYROID: normal size gland, non-tender, no discrete nodules palpated  RESP: CTA bilaterally   CV: RRR, no murmurs appreciated   Abdomen:  Benign, striae        Moderate  pannus.   Musculoskeletal: Patient ambulatory without help  Extremities:  Peripheral edema none  Skin exam:No acanthosis, No purplish striae. Intertrigo no    Psych: appropriate affect           Results:   Lab work within last 6 months was unavailable and was ordered today        Visit duration  I personally spent 87 minutes face-to-face and non-face-to-face in the care of this patient, which includes all pre, intra, and post visit time on the date of service.    Note - This record has been created using [employer]. Chart creation errors have been sought, but may not always have been located. Such creation errors do not reflect on the standard of medical care.

## 2023-09-24 LAB — VITAMIN D 25 HYDROXY: VITAMIN D, TOTAL (25OH): 20.7 ng/mL (ref 20.0–80.0)

## 2023-09-25 NOTE — Progress Notes (Signed)
 [facility] Problem Visit  [facility]  Subjective:  Natalie Jenkins is a 23 y.o. being seen today for   Chief Complaint  Patient presents with   Contraception    Wants to change from pills to Depo-on menses now-started 09-14-23    Patient is a pleasant 23 y.o. female who presents to the office today to discuss contraception methods and switch from OCP to hormonal injections. Patient indicates forgetting to take the pill daily sometimes. She has been on Depo before and reports weight gain with that but when weighing risks and benefits she would rather have weight gain than forget pill and get pregnant.  Patient indicates 1 female partner in the last 2 months. Patient reports last sex was 09/09/23 without a condom. Patient indicates LMP was 09/14/23 and has periods monthly. She does indicate taking a pregnancy test at home 2 weeks ago that was negative.     Does the patient have a current or past history of drug use? No   No components found for: "HCV"]   Health Maintenance Due  Topic Date Due   HPV VACCINES (1 - 3-dose series) Never done   COVID-19 Vaccine (3 - Moderna risk series) 12/18/2020   The following portions of the patient's history were reviewed and updated as appropriate: allergies, current medications, past family history, past medical history, past social history, past surgical history and problem list. Problem list updated.   See flowsheet for other program required questions.  Objective:   Vitals:   09/18/23 1520  BP: 131/85  Pulse: 69  Weight: 239 lb 3.2 oz (108.5 kg)  Height: 5\' 5"  (1.651 m)    Physical Exam Nursing note reviewed.  Constitutional:      Appearance: Normal appearance.  HENT:     Mouth/Throat:     Lips: Pink. No lesions.  Eyes:     General:        Right eye: No discharge.        Left eye: No discharge.  Pulmonary:     Effort: Pulmonary effort is normal.  Genitourinary:    Comments: Patient  asymptomatic. Declines genital exam and does not want STI testing.   Skin:    Comments: Skin tone appropriate for ethnicity. Assessed exposed areas only.  Neurological:     Mental Status: She is alert and oriented to person, place, and time.  Psychiatric:        Attention and Perception: Attention and perception normal.        Mood and Affect: Mood and affect normal.        Speech: Speech normal.        Behavior: Behavior normal. Behavior is cooperative.        Thought Content: Thought content normal.   Only PE documented is what could be observed during patient interview with provider. No genital exam performed.   Assessment and Plan:  Natalie Jenkins is a 23 y.o. female presenting [facility] Department for a Women's Health problem visit  1. Family planning (Primary) UPT negative in office today. Patient advised to repeat UPT at home in 2 weeks.  Depo adminstered.   - medroxyPROGESTERone (DEPO-PROVERA) injection 150 mg - Pregnancy, urine  Return in about 11 weeks (around 12/04/2023) for depo injection.  No future appointments.  Due to language barrier, a Spanish interpreter Aaron Mose.) was present in person during the history-taking, subsequent discussion, and physical exam with this patient.   Zenovia Jarred  Fransisco Messmer, NP

## 2023-10-09 MED ORDER — TRI-ESTARYLLA (28) 0.18 MG(7)/0.215 MG(7)/0.25 MG(7)-0.035 MG TABLET
ORAL_TABLET | Freq: Every day | ORAL | 3 refills | 84.00 days
Start: 2023-10-09 — End: ?

## 2023-10-09 NOTE — Unmapped (Signed)
 [facility] Refill Coordination Note    Specialty Medication(s) to be Shipped:   Inflammatory Disorders: Humira     Other medication(s) to be shipped: No additional medications requested for fill at this time     Marie Cole, DOB: 2000-07-18  Phone: There are no phone numbers on file.      All above HIPAA information was verified with patient.     Was a Nurse, learning disability used for this call? No    Completed refill call assessment today to schedule patient's medication shipment from the [facility]  [phone]).  All relevant notes have been reviewed.     Specialty medication(s) and dose(s) confirmed: Regimen is correct and unchanged.   Changes to medications: Letricia reports no changes at this time.  Changes to insurance: No  New side effects reported not previously addressed with a pharmacist or physician: None reported  Questions for the pharmacist: No    Confirmed patient received a Conservation officer, historic buildings and a Surveyor, mining with first shipment. The patient will receive a drug information handout for each medication shipped and additional FDA Medication Guides as required.       DISEASE/MEDICATION-SPECIFIC INFORMATION        For patients on injectable medications: Patient currently has 1 doses left.  Next injection is scheduled for 04.21.2025.    SPECIALTY MEDICATION ADHERENCE     Medication Adherence    Patient reported X missed doses in the last month: 0  Specialty Medication: adalimumab : HUMIRA (CF) PEN 40 mg/0.4 mL injection  Patient is on additional specialty medications: No              Were doses missed due to medication being on hold? No      adalimumab : HUMIRA (CF) PEN 40 mg/0.4 mL injection: 1 doses of medicine on hand       REFERRAL TO PHARMACIST     Referral to the pharmacist: Not needed      SHIPPING     Shipping address confirmed in Epic.     Cost and Payment: Patient has a copay of $4.00. They are aware and have authorized the pharmacy to charge the credit card on file.    Delivery Scheduled: Yes, Expected medication delivery date: 05.01.2025.     Medication will be delivered via Same Day Courier to the prescription address in Epic WAM.    Marcella Serge   [facility]  Specialty Technician

## 2023-10-18 DIAGNOSIS — M0579 Rheumatoid arthritis with rheumatoid factor of multiple sites without organ or systems involvement: Principal | ICD-10-CM

## 2023-10-18 MED ORDER — HUMIRA PEN CITRATE FREE 40 MG/0.4 ML
SUBCUTANEOUS | 3 refills | 84.00000 days
Start: 2023-10-18 — End: 2024-10-17

## 2023-10-18 NOTE — Unmapped (Signed)
 Humira refill  Last Visit Date: 09/04/2023  Next Visit Date: 03/11/2024    Lab Results   Component Value Date    ALT 44 09/04/2023    AST 32 09/04/2023    ALBUMIN 4.0 09/04/2023    CREATININE 0.54 (L) 09/04/2023     Lab Results   Component Value Date    WBC 11.8 (H) 09/04/2023    HGB 13.2 09/04/2023    HCT 38.9 09/04/2023    PLT 324 09/04/2023     Lab Results   Component Value Date    NEUTROPCT 53.5 09/04/2023    LYMPHOPCT 38.5 09/04/2023    MONOPCT 5.9 09/04/2023    EOSPCT 1.7 09/04/2023    BASOPCT 0.4 09/04/2023

## 2023-10-19 DIAGNOSIS — M0579 Rheumatoid arthritis with rheumatoid factor of multiple sites without organ or systems involvement: Principal | ICD-10-CM

## 2023-10-19 MED ORDER — HUMIRA PEN CITRATE FREE 40 MG/0.4 ML
SUBCUTANEOUS | 3 refills | 84.00000 days | Status: CP
Start: 2023-10-19 — End: ?
  Filled 2023-10-23: qty 2, 28d supply, fill #0

## 2023-10-19 NOTE — Unmapped (Signed)
 Marie Cole 's Humira shipment will be delayed as a result of prior authorization being required by the patient's insurance.     I have reached out to the patient  at [phone]  and communicated the delay. We will call the patient back to reschedule the delivery upon resolution. We have not confirmed the new delivery date.

## 2023-10-20 NOTE — Unmapped (Signed)
 Marie Cole 's Humira shipment will be rescheduled as a result of prior authorization now approved.     I have reached out to the patient  via incoming phone call and communicated the delivery change. We will reschedule the medication for the delivery date that the patient agreed upon.  We have confirmed the delivery date as 5/5, via same day courier.

## 2023-11-15 NOTE — Unmapped (Signed)
 [facility] and [facility] Delivery Pharm[facility] Clinical Assessment & Refill Coordination Note    Marie Cole, Marie Cole: 02/08/2001  Phone: There are no phone numbers on file.    All above HIPAA information was verified with patient.     Was a Nurse, learning disability used for this call? Yes, spanish. Patient language is appropriate in [facility]    Specialty Medication(s):   Inflammatory Disorders: Humira     Current Outpatient Medications   Medication Sig Dispense Refill    famotidine (PEPCID) 20 MG tablet Take 1 tablet (20 mg total) by mouth two (2) times a day as needed for heartburn. (Patient not taking: Reported on 07/22/2022) 60 tablet 5    HUMIRA PEN CITRATE FREE 40 MG/0.4 ML Inject the contents of 1 pen (40 mg total) under the skin every fourteen (14) days. 6 each 3    medroxyPROGESTERone (DEPO-PROVERA) 150 mg/mL injection Inject 1 mL (150 mg total) into the muscle.       No current facility-administered medications for this visit.        Changes to medications: Angelie reports no changes at this time.    Medication list has been reviewed and updated in Epic: Yes    No Known Allergies    Changes to allergies: No    Allergies have been reviewed and updated in Epic: Yes    SPECIALTY MEDICATION ADHERENCE         Medication Adherence    Patient reported X missed doses in the last month: 0  Specialty Medication: Humira q14d  Patient is on additional specialty medications: No  Informant: patient          Specialty medication(s) dose(s) confirmed: Regimen is correct and unchanged.     Are there any concerns with adherence? No    Adherence counseling provided? Not needed    CLINICAL MANAGEMENT AND INTERVENTION      Clinical Benefit Assessment:    Do you feel the medicine is effective or helping your condition? Yes    Clinical Benefit counseling provided? Progress note from 09/04/23 shows evidence of clinical benefit    Adverse Effects Assessment:    Are you experiencing any side effects? No    Are you experiencing difficulty administering your medicine? No    Quality of Life Assessment:    Quality of Life    Rheumatology  Oncology  Dermatology  Cystic Fibrosis          How many days over the past month did your RA  keep you from your normal activities? For example, brushing your teeth or getting up in the morning. Patient declined to answer    Have you discussed this with your provider? Not needed    Acute Infection Status:    Acute infections noted within Epic:  No active infections    Patient reported infection: None    Therapy Appropriateness:    Is therapy appropriate based on current medication list, adverse reactions, adherence, clinical benefit and progress toward achieving therapeutic goals? Yes, therapy is appropriate and should be continued     Clinical Intervention:    Was an intervention completed as part of this clinical assessment? No    DISEASE/MEDICATION-SPECIFIC INFORMATION      For patients on injectable medications: Patient currently has 0 doses left.  Next injection is scheduled for 6/7.    Chronic Inflammatory Diseases: Have you experienced any flares in the last month? No    PATIENT SPECIFIC NEEDS     Does the patient have  any physical, cognitive, or cultural barriers? No    Is the patient high risk? No    Does the patient require physician intervention or other additional services (i.e., nutrition, smoking cessation, social work)? No    Does the patient have an additional or emergency contact listed in their chart? Yes    SOCIAL DETERMINANTS OF HEALTH     At the [facility], we have learned that life circumstances - like trouble affording food, housing, utilities, or transportation can affect the health of many of our patients.   That is why we wanted to ask: are you currently experiencing any life circumstances that are negatively impacting your health and/or quality of life? Patient declined to answer    Social Drivers of Health     Food Insecurity: No Food Insecurity (06/02/2023)    Hunger Vital Sign     Worried About Running Out of Food in the Last Year: Never true     Ran Out of Food in the Last Year: Never true   Tobacco Use: Low Risk  (09/21/2023)    Patient History     Smoking Tobacco Use: Never     Smokeless Tobacco Use: Never     Passive Exposure: Never   Recent Concern: Tobacco Use - Medium Risk (09/18/2023)    Received from [facility]    Patient History     Smoking Tobacco Use: Former     Smokeless Tobacco Use: Never     Passive Exposure: Never   Transportation Needs: No Transportation Needs (06/02/2023)    PRAPARE - Therapist, art (Medical): No     Lack of Transportation (Non-Medical): No   Alcohol Use: Not on file   Housing: Low Risk  (06/02/2023)    Housing     Within the past 12 months, have you ever stayed: outside, in a car, in a tent, in an overnight shelter, or temporarily in someone else's home (i.e. couch-surfing)?: No     Are you worried about losing your housing?: No   Physical Activity: Not on file   Utilities: Low Risk  (06/02/2023)    Utilities     Within the past 12 months, have you been unable to get utilities (heat, electricity) when it was really needed?: No   Stress: No Stress Concern Present (02/12/2019)    Received from [facility]    [facility] - Occupational Stress Questionnaire     Feeling of Stress : Only a little   Interpersonal Safety: Not on file   Substance Use: Not on file (04/26/2023)   Intimate Partner Violence: Not At Risk (04/26/2023)    Received from [facility]    Humiliation, Afraid, Rape, and Kick questionnaire     Fear of Current or Ex-Partner: No     Emotionally Abused: No     Physically Abused: No     Sexually Abused: No   Social Connections: Not on file   Financial Resource Strain: Low Risk  (05/19/2022)    Overall Financial Resource Strain (CARDIA)     Difficulty of Paying Living Expenses: Not hard at all   Health Literacy: Not on file   Internet Connectivity: Not on file       Would you be willing to receive help with any of the needs that you have identified today? Not applicable       SHIPPING     Specialty Medication(s) to be Shipped:   Inflammatory  Disorders: Humira    Other medication(s) to be shipped: No additional medications requested for fill at this time     Changes to insurance: No    Cost and Payment: Patient has a copay of $4. They are aware and have authorized the pharmacy to charge the credit card on file.    Delivery Scheduled: Yes, Expected medication delivery date: 6/3.     Medication will be delivered via Same Day Courier to the confirmed prescription address in [facility].    The patient will receive a drug information handout for each medication shipped and additional FDA Medication Guides as required.  Verified that patient has previously received a Conservation officer, historic buildings and a Surveyor, mining.    The patient or caregiver noted above participated in the development of this care plan and knows that they can request review of or adjustments to the care plan at any time.      All of the patient's questions and concerns have been addressed.    Sherle Dire, PharmD   [facility] Special[facility] and Home Delivery Pharmacy Specialty Pharmacist

## 2023-11-21 MED FILL — HUMIRA PEN CITRATE FREE 40 MG/0.4 ML: SUBCUTANEOUS | 28 days supply | Qty: 2 | Fill #1

## 2023-12-05 ENCOUNTER — Ambulatory Visit

## 2023-12-05 VITALS — BP 109/65 | Ht 65.0 in | Wt 249.0 lb

## 2023-12-05 DIAGNOSIS — Z30013 Encounter for initial prescription of injectable contraceptive: Secondary | ICD-10-CM | POA: Diagnosis not present

## 2023-12-05 DIAGNOSIS — Z3009 Encounter for other general counseling and advice on contraception: Secondary | ICD-10-CM

## 2023-12-05 DIAGNOSIS — Z309 Encounter for contraceptive management, unspecified: Secondary | ICD-10-CM

## 2023-12-05 DIAGNOSIS — Z3042 Encounter for surveillance of injectable contraceptive: Secondary | ICD-10-CM

## 2023-12-05 NOTE — Progress Notes (Signed)
 11 weeks and 1 day post depo. Voices no concerns. Depo given today per order by Claryce Cruel, NP dated 09/18/2023. Tolerated well in R deltoid. Next depo due 02/20/2024; patient aware.  Interpreter, Kem Patten, helped during this visit.   Clare Critchley, RN

## 2023-12-11 NOTE — Unmapped (Signed)
 The [facility] has made a second and final attempt to reach this patient to refill the following medication:adalimumab : HUMIRA (CF) PEN 40 mg/0.4 mL injection.      We have left voicemails on the following phone numbers: [phone] and have sent a text message to the following phone numbers: [phone].    Dates contacted: 6.17.25 and 6.23.25  Last scheduled delivery: 6.3.25    The patient may be at risk of non-compliance with this medication. The patient should call the [facility] at [phone]  Option 4, then Option 2: Dermatology, Gastroenterology, Rheumatology to refill medication.    Doyal Hurst   [facility] Specialty and [facility]

## 2023-12-28 NOTE — Unmapped (Signed)
 [facility] Special[facility] and Home Delivery Pharmacy Refill Coordination Note    Specialty Medication(s) to be Shipped:   Inflammatory Disorders: Humira     Other medication(s) to be shipped: No additional medications requested for fill at this time     Marie Cole, DOB: [birth year]  Phone: There are no phone numbers on file.      All above HIPAA information was verified with patient.     Was a Nurse, learning disability used for this call? Yes, Spanish [id number]. Patient language is appropriate in [facility]    Completed refill call assessment today to schedule patient's medication shipment from the [facility] Delivery Pharm[facility]  [phone]).  All relevant notes have been reviewed.     Specialty medication(s) and dose(s) confirmed: Regimen is correct and unchanged.   Changes to medications: Marie Cole reports no changes at this time.  Changes to insurance: No  New side effects reported not previously addressed with a pharmacist or physician: None reported  Questions for the pharmacist: No    Confirmed patient received a Conservation officer, historic buildings and a Surveyor, mining with first shipment. The patient will receive a drug information handout for each medication shipped and additional FDA Medication Guides as required.       DISEASE/MEDICATION-SPECIFIC INFORMATION        For patients on injectable medications: Patient currently has 0 doses left.  Next injection is scheduled for 01/03/24.    SPECIALTY MEDICATION ADHERENCE     Medication Adherence    Patient reported X missed doses in the last month: 0  Specialty Medication: adalimumab : HUMIRA (CF) PEN 40 mg/0.4 mL injection  Patient is on additional specialty medications: No              Were doses missed due to medication being on hold? No    HUMIRA (CF) PEN 40 mg/0.4 mL injection (adalimumab ): 0 doses of medicine on hand       REFERRAL TO PHARMACIST     Referral to the pharmacist: Not needed      [facility]     Shipping address confirmed in Epic.     Cost and Payment: Patient has a copay of $4. They are aware and have authorized the pharmacy to charge the credit card on file.    Delivery Scheduled: Yes, Expected medication delivery date: 01/02/24.     Medication will be delivered via Same Day Courier to the prescription address in Epic WAM.    Delwin Raczkowski   [facility] Special[facility] and Home Delivery Pharmacy  Specialty Technician

## 2024-01-02 MED FILL — HUMIRA PEN CITRATE FREE 40 MG/0.4 ML: SUBCUTANEOUS | 28 days supply | Qty: 2 | Fill #2

## 2024-01-29 NOTE — Unmapped (Signed)
 The [facility] has made a second and final attempt to reach this patient to refill the following medication:HUMIRA (CF) PEN 40 mg/0.4 mL injection (adalimumab ).      We have left voicemails on the following phone numbers: [phone] and have sent a text message to the following phone numbers: [phone].    Dates contacted: 8.4.25 and 8.11.25  Last scheduled delivery: 7.15.25    The patient may be at risk of non-compliance with this medication. The patient should call the [facility] at [phone]  Option 4, then Option 2: Dermatology, Gastroenterology, Rheumatology to refill medication.    Marie Cole   [facility] Specialty and [facility]

## 2024-02-05 NOTE — Unmapped (Signed)
 [facility] Specialty and Home Delivery Pharmacy Refill Coordination Note    Specialty Medication(s) to be Shipped:   Inflammatory Disorders: Humira     Other medication(s) to be shipped: No additional medications requested for fill at this time    Specialty Medications not needed at this time: N/A     Marie Cole, DOB: 01-29-2001  Phone: There are no phone numbers on file.      All above HIPAA information was verified with patient.     Was a Nurse, learning disability used for this call? Yes, Spanish. Patient language is appropriate in [name]    Completed refill call assessment today to schedule patient's medication shipment from the [facility] Specialty and Home Delivery Pharmacy  [phone]).  All relevant notes have been reviewed.     Specialty medication(s) and dose(s) confirmed: Regimen is correct and unchanged.   Changes to medications: Vennela reports no changes at this time.  Changes to insurance: No  New side effects reported not previously addressed with a pharmacist or physician: None reported  Questions for the pharmacist: No    Confirmed patient received a Conservation officer, historic buildings and a Surveyor, mining with first shipment. The patient will receive a drug information handout for each medication shipped and additional FDA Medication Guides as required.       DISEASE/MEDICATION-SPECIFIC INFORMATION        For patients on injectable medications: Next injection is scheduled for 02/07/24.    SPECIALTY MEDICATION ADHERENCE     Medication Adherence    Patient reported X missed doses in the last month: 0  Specialty Medication: adalimumab : HUMIRA (CF) PEN 40 mg/0.4 mL injection  Patient is on additional specialty medications: No              Were doses missed due to medication being on hold? No    HUMIRA (CF) PEN 40 mg/0.4 mL injection (adalimumab ): 0 doses of medicine on hand     REFERRAL TO PHARMACIST     Referral to the pharmacist: Not needed      [facility]     Shipping address confirmed in Epic.     Cost and Payment: Patient has a copay of $4. They are aware and have authorized the pharmacy to charge the credit card on file.    Delivery Scheduled: Yes, Expected medication delivery date: 02/06/24.     Medication will be delivered via Same Day Courier to the prescription address in Epic WAM.    [name]   [facility] Specialty and Home Delivery Pharmacy  Specialty Technician

## 2024-02-06 MED FILL — HUMIRA PEN CITRATE FREE 40 MG/0.4 ML: SUBCUTANEOUS | 28 days supply | Qty: 2 | Fill #3

## 2024-02-06 NOTE — Unmapped (Signed)
 patient is experiencing sharp hip pain and her humira  doesnt come until tomorrow she would like to know what she can take. Tylenol did not help

## 2024-02-07 NOTE — Unmapped (Signed)
 Called patient to follow-up on her concern about hip pain refractory to tylenol and to confirm that she was able to start her humira  today.     Unfortunately was unable to connect with her via phone, but left a voicemail using interpreter services and advised her to take ibuprofen  600mg  up to three times today. If pain does not improve I asked her to call us  back in the clinic.     Maurilio El, MD  Rheumatology Fellow PGY-4

## 2024-03-05 NOTE — Unmapped (Signed)
 The [facility] has made a second and final attempt to reach this patient to refill the following medication:HUMIRA (CF) PEN 40 mg/0.4 mL injection (adalimumab ).      We have left voicemails on the following phone numbers: [phone] and have sent a text message to the following phone numbers: [phone].    Dates contacted: 9.9.25 and 9.16.25  Last scheduled delivery: 8.19.25    The patient may be at risk of non-compliance with this medication. The patient should call the [facility] at [phone]  Option 4, then Option 2: Dermatology, Gastroenterology, Rheumatology to refill medication.    Doyal Hurst   [facility] Specialty and [facility]

## 2024-03-07 NOTE — Unmapped (Signed)
 [facility] Specialty and Home Delivery Pharmacy Refill Coordination Note    Specialty Medication(s) to be Shipped:   Inflammatory Disorders: Humira     Other medication(s) to be shipped: No additional medications requested for fill at this time    Specialty Medications not needed at this time: N/A     Marie Cole, DOB: 08-04-2000  Phone: There are no phone numbers on file.      All above HIPAA information was verified with patient.     Was a Nurse, learning disability used for this call? Yes, Spanish. Patient language is appropriate in [facility]    Completed refill call assessment today to schedule patient's medication shipment from the [facility] Specialty and Home Delivery Pharmacy  [phone]).  All relevant notes have been reviewed.     Specialty medication(s) and dose(s) confirmed: Regimen is correct and unchanged.   Changes to medications: [name] reports no changes at this time.  Changes to insurance: No  New side effects reported not previously addressed with a pharmacist or physician: None reported  Questions for the pharmacist: No    Confirmed patient received a Conservation officer, historic buildings and a Surveyor, mining with first shipment. The patient will receive a drug information handout for each medication shipped and additional FDA Medication Guides as required.       DISEASE/MEDICATION-SPECIFIC INFORMATION        For patients on injectable medications: Next injection is scheduled for 9/18 will take on 9/19.    SPECIALTY MEDICATION ADHERENCE     Medication Adherence    Patient reported X missed doses in the last month: 0  Specialty Medication: HUMIRA (CF) PEN 40 mg/0.4 mL injection (adalimumab )  Patient is on additional specialty medications: No              Were doses missed due to medication being on hold? No    Humira  40/0.4 mg/ml: 0 doses of medicine on hand        REFERRAL TO PHARMACIST     Referral to the pharmacist: Not needed      [facility]     Shipping address confirmed in Epic.     Cost and Payment: Patient has a $0 copay, payment information is not required.    Delivery Scheduled: Yes, Expected medication delivery date: 03/08/24.     Medication will be delivered via same day courier  to the prescription address in Epic WAM.    Kelly CHRISTELLA Eagles   [facility] Specialty and Home Delivery Pharmacy  Specialty Technician

## 2024-03-08 MED FILL — HUMIRA PEN CITRATE FREE 40 MG/0.4 ML: SUBCUTANEOUS | 28 days supply | Qty: 2 | Fill #4

## 2024-03-10 NOTE — Unmapped (Signed)
 RHEUMATOLOGY CLINIC FOLLOW-UP NOTE      Assessment/Plan:      Marie Cole is a 23 y.o. female with a history of iron  deficiency anemia, subclinical hypothyroidism, GERD, and obesity, who presents for follow up of seropositive rheumatoid arthritis.     Seropositive Rheumatoid arthritis:   Positive RF 397.3 and positive anti-CCP antibody > 340. Xray hands 11/0/23 without erosive disease. Joint pain and inflammation improved with initiation of humira . Only endorsing intermittent right sided hip pain, which is not present on exam today, and likely related to trochanteric bursitis.   - continue Humira  40 mg every 14 days   - will defer starting methotrexate since RA well controlled and patient may wish to become pregnant in the next few years    High risk medication use:  - Patient is currently taking Humira , immunosuppressant medications that requires intensive monitoring. This monitoring was done today through history, physical, and/or lab testing  - Reviewed CBC, BUN, Cr, albumin, AST, and ALT from 03/172025 which was largely wnl (mild leukocytosis, no anemia)  - repeat medication monitoring labs today    Obesity:  Patient seen by bariatric surgery 09/2023 and deemed to be a good candidate for surgery. However, no follow-up appointments have been made at this time. Will submit a re-referral for her to be counseled further with regard to surgery vs. Weight loss medication. Her weight gain is likely a complication of prior prednisone  use. Fortunately her RA is well controlled now and she has been off of steroids since 2023.  -consider alternate form of contraception- currently on depo-provera  -re-referral sent for bariatric surgery    Fatigue:  Patient endorses persistent fatigue with daytime somnolence and waking up tired. Normal daily tasks seem to require more effort than usual. Her most recent CBC did not indicate anemia, but she does have continued iron  deficiency. Normal TSH 09/21/2023. She does not report snoring, but is unsure. She is at risk of sleep apnea with her elevated BMI.   -encouraged to resume iron  supplementation every other day  -sleep study ordered- I will coordinate follow-up with PCP    Health Maintenance  - PCV-20: Up to date   - Annual flu vaccination: due , but patient declined  - COVID Vaccination: Due, patient declined vaccination   - Contraception: depo- provera- last given 12/05/23 (due q3 months)     Return in about 6 months (around 09/08/2024).    This patient was seen and discussed with Dr. Shary who agrees with the plan outlined above.    Marie El, MD  Rheumatology Fellow PGY-4      Subjective:   Primary Care Provider: Uvaldo Merlynn MANIFOLD, MD     Marie Cole is a 23 y.o. female with a PMH of iron  deficiency anemia, subclinical hypothyroidism, and GERD follow-up of seropositive rheumatoid arthritis.      Last clinic visit 09/04/2023 her joint symptoms were improved on Q2 week Humira  injections.  Her labs at that time showed normalization of liver enzymes.     She has a past liver injury related to her gallbladder in 2023, which normalized but showed elevated liver enzymes after starting Humira . She is concerned about the potential impact of her medication on her liver health.    Regarding her rheumatoid arthritis, she experiences joint pain primarily during exercise or strenuous activities. She reports no significant joint pain, stiffness, or swelling upon waking in the morning. Her principal concern today is that she has not been able to lose  the weight that she gained while on prednisone  in 2023 at the beginning of her course with RA. She has tried exercise and diet strategies, but finds that her physical activity is limited by fatigue. She was seen by bariatric surgery in April and told that she was a good candidate for surgery as well as weight loss medication, but was not scheduled for follow-up.     With regard to her fatigue, she feels that she sleeps through the night for 6-8 hours. She has a 25year old and a 25 year old, but they do not interrupt her sleep and she does not wake up at night with joint pain. She is not sure if she snores at night, but when she wakes in the morning she does not feel well rested. She has had to cut back her work hours from 6 days a week to 3 due to her day time somnolence.    On review of systems she has had no recent fever, rashes, easy bruising, blood in urine or stool ,joint swelling, dizziness, falls, or other trauma. No dry eye or dry mouth symptoms.       Disease History:  Started having joint pain during a  hospitalization 04/15/22 for acute liver injury from cholelithiasis sp cholecystectomy on 10/29. At the time attributed to possible reactive viral arthritis that she was positive for rhinovirus and enterovirus.  But pain persisted and she was having trouble performing ADLs and taking care of her new baby.  Her pain involves multiple sites including hands, feet, knees and shoulders.  Found to have positive RF 397.3 and positive anti-CCP antibody > 340 also ANA +1: 80. Established care with [facility] 06/11/23. Started plaquenil  since safe in breastfeeding. Seen in clinic 10/10/22 started on humira  w/ o methotrexate  since she was breast feeding.     Prior treatments:  - hydroxychloroquine  -- discontinued due to patient reported fatigue, chest pressure, and redness in her face  - humira  started 10/10/2022     PMH, PFH, PSH as previously documented.     Social history:   Patient does not smoke tobacco products or consume alcohol. She is a Production designer, theatre/television/film at [employer]. She is originally from [place] and now lives in [place] with her partner and two children (2yo and 4yo).      Medications were reviewed this visit.  Current Outpatient Medications on File Prior to Visit   Medication Sig    famotidine  (PEPCID ) 20 MG tablet Take 1 tablet (20 mg total) by mouth two (2) times a day as needed for heartburn. (Patient not taking: Reported on 07/22/2022)    HUMIRA  PEN CITRATE FREE 40 MG/0.4 ML Inject the contents of 1 pen (40 mg total) under the skin every fourteen (14) days.    medroxyPROGESTERone (DEPO-PROVERA) 150 mg/mL injection Inject 1 mL (150 mg total) into the muscle.     No current facility-administered medications on file prior to visit.       Immunization were reviewed this visit.  Immunization History   Administered Date(s) Administered    COVID-19 VACCINE,MRNA(MODERNA)(PF) 09/18/2020, 11/20/2020    HEPATITIS B VACCINE ADULT, ADJUVANTED, IM(HEPLISAV B) 05/11/2023    Human Papillomavirus Vaccine,9-Valent(PF) 05/11/2023    INFLUENZA VACCINE IIV3(IM)(PF)6 MOS UP 04/10/2023    INFLUENZA VACCINE QUAD (IIV4 PF) 58MO+ INJECTABLE  06/06/2019, 08/23/2021    Influenza Vaccine Quad(IM)6 MO-Adult(PF) 06/06/2019, 08/23/2021    Influenza Virus Vaccine, unspecified formulation 06/17/2019, 08/23/2021    MMR 08/31/2019, 10/14/2019    Pneumococcal Conjugate 20-valent 07/22/2022  TdaP 06/06/2019, 11/16/2021       Objective:     Vitals:    03/11/24 1424   BP: 98/67   Pulse: 77   Temp: 36.5 ??C (97.7 ??F)   TempSrc: Temporal   Weight: (!) 113 kg (249 lb 3.2 oz)       Body mass index is 44.14 kg/m??.    Physical Exam  Gen: no acute distress.  Well nourished and well kempt.  Head: No alopecia. [place]/AT.  Eyes: EOMI. PERRL. No conjunctival injection. No scleral icterus. Adequate tear meniscus.   Ears: Normal external exam. Normal TM's.  Nose/Oral:  No eyrthema in the nares or nasal polyps.  Clear oropharynx. No oral ulcers. Adequate sublingual salivary pooling.   Neck:  Supple, no JVD.  No thyromegaly.  No carotid bruit.   Lymph: No lymphadenopathy of cervical, supraclavicular, axillary or inguinal areas.  Chest: No increased work of breathing. CTAB.  Cardiovascular: RRR, normal s1/s2, no murmurs, rubs, or gallops appreciated.  2+ distal pulses equal bilaterally.  MSK:     Hands: Non-tender. No swelling/synovitis. Full ROM. Able to make fist.    Wrists: Non-tender. No swelling/synovitis. Full ROM.    Elbows: Non-tender. No swelling/synovitis. Full ROM.    Shoulders: Tenderness bilateral shoulders. Full ROM.    Neck: Non-tender to mid-line palpation. Full ROM.    Back: Non-tender to mid-line palpation & SI joints. No deformities. Full ROM.    Hips: Non-tender. Full ROM.    Knees: Non-tender. No swelling/synovitis. Full ROM.    Ankles: Non-tender. No swelling/synovitis. Full ROM.    Feet: Non-tender. No swelling/synovitis. Full ROM.   Neuro:  AAO x 3.  Able to move all extremities.   Skin:  No rashes, nodules or dry skin noted.    Psych: Normal mood and affect. Normal thought process.    Swollen Joint Count (0-28): 0  Tender Joint Count (0-28): 0  Patient Global Assessment of Disease Activity (0-10): 0  Evaluator Global Assessment of Disease (0-10): 0  CDAI Score: 0  CDAI interpretation:  0.0-2.8 Remission   2.9-10.0 Low disease activity   10.1-22.0 Moderate disease activity   22.1-76.0 High disease activity       Test Results  Office Visit on 03/11/2024   Component Date Value    Creatinine 03/11/2024 0.54 (L)     eGFR CKD-EPI (2021) Fema* 03/11/2024 >90     ALT 03/11/2024 31     AST 03/11/2024 20     BUN 03/11/2024 7 (L)     WBC 03/11/2024 12.6 (H)     RBC 03/11/2024 4.59     HGB 03/11/2024 13.0     HCT 03/11/2024 40.3     MCV 03/11/2024 87.8     MCH 03/11/2024 28.3     MCHC 03/11/2024 32.3     RDW 03/11/2024 14.2     MPV 03/11/2024 8.4     Platelet 03/11/2024 323     Neutrophils % 03/11/2024 59.9     Lymphocytes % 03/11/2024 33.4     Monocytes % 03/11/2024 5.0     Eosinophils % 03/11/2024 1.2     Basophils % 03/11/2024 0.5     Absolute Neutrophils 03/11/2024 7.5     Absolute Lymphocytes 03/11/2024 4.2 (H)     Absolute Monocytes 03/11/2024 0.6     Absolute Eosinophils 03/11/2024 0.2     Absolute Basophils 03/11/2024 0.1

## 2024-03-11 ENCOUNTER — Ambulatory Visit: Admit: 2024-03-11 | Discharge: 2024-03-12 | Payer: Medicaid (Managed Care)

## 2024-03-11 DIAGNOSIS — Z6838 Body mass index (BMI) 38.0-38.9, adult: Principal | ICD-10-CM

## 2024-03-11 DIAGNOSIS — E661 Drug-induced obesity: Principal | ICD-10-CM

## 2024-03-11 DIAGNOSIS — M0579 Rheumatoid arthritis with rheumatoid factor of multiple sites without organ or systems involvement: Principal | ICD-10-CM

## 2024-03-11 DIAGNOSIS — R5383 Other fatigue: Principal | ICD-10-CM

## 2024-03-11 DIAGNOSIS — E66813 Class 3 drug-induced obesity with body mass index (BMI) of 40.0 to 44.9 in adult, unspecified whether serious comorbidity present (CMS-HCC): Principal | ICD-10-CM

## 2024-03-11 DIAGNOSIS — Z6841 Body Mass Index (BMI) 40.0 and over, adult: Principal | ICD-10-CM

## 2024-03-11 DIAGNOSIS — D509 Iron deficiency anemia, unspecified: Principal | ICD-10-CM

## 2024-03-11 LAB — CBC W/ AUTO DIFF
BASOPHILS ABSOLUTE COUNT: 0.1 10*9/L (ref 0.0–0.1)
BASOPHILS RELATIVE PERCENT: 0.5 %
EOSINOPHILS ABSOLUTE COUNT: 0.2 10*9/L (ref 0.0–0.5)
EOSINOPHILS RELATIVE PERCENT: 1.2 %
HEMATOCRIT: 40.3 % (ref 34.0–44.0)
HEMOGLOBIN: 13 g/dL (ref 11.3–14.9)
LYMPHOCYTES ABSOLUTE COUNT: 4.2 10*9/L — ABNORMAL HIGH (ref 1.1–3.6)
LYMPHOCYTES RELATIVE PERCENT: 33.4 %
MEAN CORPUSCULAR HEMOGLOBIN CONC: 32.3 g/dL (ref 32.0–36.0)
MEAN CORPUSCULAR HEMOGLOBIN: 28.3 pg (ref 25.9–32.4)
MEAN CORPUSCULAR VOLUME: 87.8 fL (ref 77.6–95.7)
MEAN PLATELET VOLUME: 8.4 fL (ref 6.8–10.7)
MONOCYTES ABSOLUTE COUNT: 0.6 10*9/L (ref 0.3–0.8)
MONOCYTES RELATIVE PERCENT: 5 %
NEUTROPHILS ABSOLUTE COUNT: 7.5 10*9/L (ref 1.8–7.8)
NEUTROPHILS RELATIVE PERCENT: 59.9 %
PLATELET COUNT: 323 10*9/L (ref 150–450)
RED BLOOD CELL COUNT: 4.59 10*12/L (ref 3.95–5.13)
RED CELL DISTRIBUTION WIDTH: 14.2 % (ref 12.2–15.2)
WBC ADJUSTED: 12.6 10*9/L — ABNORMAL HIGH (ref 3.6–11.2)

## 2024-03-11 LAB — BUN: BLOOD UREA NITROGEN: 7 mg/dL — ABNORMAL LOW (ref 9–23)

## 2024-03-11 LAB — CREATININE
CREATININE: 0.54 mg/dL — ABNORMAL LOW (ref 0.55–1.02)
EGFR CKD-EPI (2021) FEMALE: >90 mL/min/1.73m2 (ref >=60)

## 2024-03-11 LAB — AST: AST (SGOT): 20 U/L (ref <=34)

## 2024-03-11 LAB — ALT: ALT (SGPT): 31 U/L (ref 10–49)

## 2024-03-11 MED ORDER — FERROUS SULFATE 325 MG (65 MG IRON) TABLET,DELAYED RELEASE
ORAL_TABLET | ORAL | 3 refills | 120.00000 days | Status: CP
Start: 2024-03-11 — End: 2025-07-04

## 2024-03-11 NOTE — Unmapped (Addendum)
 Ms. Morazan Ortega,    ??Qu?? gusto verte hoy! Aqu?? tienes un resumen de [employer] plan para tu visita:    1) Laboratorios hoy para monitorear la salud del h??gado y los ri??ones    2) Por favor, comience a tomar suplementos de Lawyer d??as.    3)Por ahora Educational psychologist con Humira  [employer], podemos aumentar esto en el futuro si es necesario.    4) Te har?? una nueva referencia para que puedas ver al cirujano bari??trico.    5) Tambi??n le enviar?? un mensaje a su m??dico de cabecera, Merlynn Lennox. Ser?? importante que consulte con ella sobre su deficiencia de hierro y otros medicamentos que puedan ayudarle a Publishing copy de [place].    6) por favor llame a Sleep Study-  [phone] para evaluar su fatiga    Si tienes alguna pregunta, llama a la [facility] [place] al [phone] durante el horario de atenci??n o env??ame un mensaje a trav??s de MyChart. Las respuestas a los mensajes de MyChart tardan dos d??as h??biles. Para consultas urgentes fuera del horario de atenci??n y los fines de [place], llama a la l??nea de enfermer??a de [employer] al [phone].    Cu??date, Dra. Maurilio El

## 2024-03-12 NOTE — Unmapped (Signed)
 Weight Management Services Financial Information Form    Hospital NPI: [id number] / Physicians NPI: [id number]   [facility] NPI: [id number]   [place] NPI: [id number]  [place] NPI: [id number]    MRN: [id number]  Patient: Marie Cole    Insurance Company: Medicaid  Telephone:  Policy/Subscriber ID: [id number]     Subscriber Information (if different from patient)  Name: n/a  Date of Birth:  n/a     Is insurance in-network: Yes.    Is Bariatric Surgery Covered: Yes  Covered Surgeries:   43644: Laparoscopic roux-en-y gastric bypass   43775: Laparoscopic gastric sleeve     **Completion of this form does not guarantee coverage and is subject to change. The patient is instructed at the time of scheduling that they need to call their insurance company directly to confirm bariatric surgery coverage benefits. If the patient has questions about coinsurance, deductibles or out-of-pocket expenses they should call their insurance carrier or the financial counselor for upfront surgery cost questions at [phone]**    What requirements must be met for surgery to be approved:  Documented BMI > 35 for at least 12 months prior to requesting surgery w/ one co-morbidity. Patient must see surgeon before appointment with Bariatric Intake Clinic    Physician Supervised Diet? Yes How Long? 3 months  Does it have to be consecutive? Yes  Does it have to be done by PCP or can it be by a dietitian?  Either (RD if physician supervised)  How recent does it have to be? (past , , etc.) 1 year (but 3 consecutive months within year)  Do I need a weight history? (20yr, 49yrs, 40yrs) Yes  Do I need a clearance/referral letter from a doctor? Yes   If yes, does it have to come from PCP? Yes  Are nutrition consultations a covered benefit? Yes  (List next to visit type max number of visits, if applicable)  02197: Initial 1:1 nutrition counseling   [phone]: Follow up 1:1 nutrition counseling   97804: Group MNT nutrition counseling

## 2024-04-04 NOTE — Unmapped (Signed)
 The [facility] has made a second and final attempt to reach this patient to refill the following medication:adalimumab : HUMIRA (CF) PEN 40 mg/0.4 mL injection.      We have left voicemails on the following phone numbers: [phone] and have sent a text message to the following phone numbers: [phone].    Dates contacted: 10.9.25 and 10.16.25  Last scheduled delivery: 9.19.25    The patient may be at risk of non-compliance with this medication. The patient should call the [facility] at [phone]  Option 4, then Option 2: Dermatology, Gastroenterology, Rheumatology to refill medication.    Marie Cole   [facility]

## 2024-04-10 NOTE — Unmapped (Signed)
 [facility] Refill Coordination Note    Specialty Medication(s) to be Shipped:   Inflammatory Disorders: Humira     Other medication(s) to be shipped: No additional medications requested for fill at this time    Specialty Medications not needed at this time: N/A     Marie Cole, DOB: 02-04-2001  Phone: There are no phone numbers on file.      All above HIPAA information was verified with patient.     Was a Nurse, learning disability used for this call? Yes, Interpreter ID [id number]. Patient language is appropriate in [facility]    Completed refill call assessment today to schedule patient's medication shipment from the [facility]  [phone]).  All relevant notes have been reviewed.     Specialty medication(s) and dose(s) confirmed: Regimen is correct and unchanged.   Changes to medications: [name] reports no changes at this time.  Changes to insurance: No  New side effects reported not previously addressed with a pharmacist or physician: None reported  Questions for the pharmacist: No    Confirmed patient received a Conservation officer, historic buildings and a Surveyor, mining with first shipment. The patient will receive a drug information handout for each medication shipped and additional FDA Medication Guides as required.       DISEASE/MEDICATION-SPECIFIC INFORMATION        For patients on injectable medications: Next injection is scheduled for 10.18.25.    SPECIALTY MEDICATION ADHERENCE     Medication Adherence    Patient reported X missed doses in the last month: 0  Specialty Medication: adalimumab : HUMIRA (CF) PEN 40 mg/0.4 mL injection  Patient is on additional specialty medications: No              Were doses missed due to medication being on hold? No      adalimumab : HUMIRA (CF) PEN 40 mg/0.4 mL injection: 0 doses of medicine on hand       REFERRAL TO PHARMACIST     Referral to the pharmacist: Not needed      SHIPPING     Shipping address confirmed in Epic.     Cost and Payment: Patient has a copay of $4. They are aware and have authorized the pharmacy to charge the credit card on file.    Delivery Scheduled: Yes, Expected medication delivery date: 10.23.25.     Medication will be delivered via Same Day Courier to the prescription address in Epic WAM.    Doyal Hurst   [facility]  Specialty Technician

## 2024-04-11 MED FILL — HUMIRA PEN CITRATE FREE 40 MG/0.4 ML: SUBCUTANEOUS | 28 days supply | Qty: 2 | Fill #5

## 2024-04-19 ENCOUNTER — Ambulatory Visit
Admit: 2024-04-19 | Discharge: 2024-04-20 | Payer: Medicaid (Managed Care) | Attending: Physician Assistant | Primary: Physician Assistant

## 2024-04-19 DIAGNOSIS — Z6841 Body Mass Index (BMI) 40.0 and over, adult: Principal | ICD-10-CM

## 2024-04-19 DIAGNOSIS — E66813 Class 3 severe obesity with serious comorbidity and body mass index (BMI) of 40.0 to 44.9 in adult, unspecified obesity type (CMS-HCC): Principal | ICD-10-CM

## 2024-04-19 NOTE — Progress Notes (Addendum)
 [facility]    Obesity Medicine summary at the initial visit on 09/20/2023  Marie Cole is a 23 y.o. female and Body mass index is 44.97 kg/m??..  She presents with obesity.  Her target weight is 135-140 lbs .  Her weight gain history is suggestive of a  post partum weight gain and retention.     Co-morbidities at this time include Rheumatoid arthritis, Iron  Deficiency Anemia.  [place] Obesity Staging Score is 2          Treatment course  Starting weight at presentation: 242.2 lbs  Weight today: 257.6 lbs  Weight loss since last visit about 6 months ago ago:n/a  Total weight loss since presentation:n/a   Percent weight loss since presentation:n/a  Percent weight loss with Lifestyle modifications : n/a    Previous use of anti-obesity medications:      Narrative history  History of Present Illness  Marie Cole is a 23 year old female who presents for obesity and weight management consultation.    Her dietary habits are inconsistent, often skipping breakfast if she wakes up late. When she does have breakfast, it includes an egg, milk, or coffee. Lunch typically consists of chicken, rice, and occasionally salad. Dinner is heavier and consumed based on hunger cues. She drinks regular soda with meals, which she considers a 'bad habit'. She attempts to start meals with protein, followed by salad, and then carbohydrates. Meals take about 30 minutes, and she does not wait after meals to drink.    She denies snacking between meals over the past month, consuming only coffee, tea, or water without added sugar. She denies alcohol consumption and does not use tobacco or recreational drugs, including CBD products.    Her physical activity has decreased over the past four months due to work commitments. She works morning shifts from 4 AM to 12 PM and evening shifts with [employer], which limits her time for exercise. 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    She reports sleeping 8-9 hours on weekdays and 5-6 hours on weekends. She feels rested in the morning but sometimes stays up late due to work commitments. Her rheumatologist recommended sleep studies, but the patient reports sleeping well and does not know why the studies were recommended. Stop Bang 1/8 on 09/20/2023    She experiences stress related to bills, children, life, and work but manages it by sleeping or reading. She feels capable of managing her stress and denies any impact on her well-being. No feelings of depression or self-harm.    She is currently taking iron  supplements every other day as prescribed by her rheumatologist due to previously low iron  levels.       Binge Eating Disorder Screening  Episodes of eating unusually large amounts of food in a short period: No    Frequency of episodes: N/A  Sense of loss of control during episodes: No        Marie Cole has Iron  deficiency anemia; History of difficult pregnancy; History of cholecystectomy; Liver injury, sequela; Breastfeeding (infant); Rheumatoid arthritis involving multiple sites with positive rheumatoid factor    (CMS-HCC); Gastroesophageal reflux disease without esophagitis; and BMI 38.0-38.9,adult on their problem list.     has a current medication list which includes the following prescription(s): ferrous sulfate , humira (cf) pen, medroxyprogesterone, and famotidine .     has no known allergies.      reports that she has never smoked. She has never been exposed to tobacco  smoke. She has never used smokeless tobacco. She reports that she does not currently use alcohol. She reports that she does not use drugs.       Vital Signs  BP 111/71 (BP Site: L Arm, BP Position: Sitting, BP Cuff Size: Large)  - Pulse 77  - Temp 36.4 ??C (97.6 ??F) (Oral)  - Ht 161.2 cm (5' 3.47)  - Wt (!) 116.8 kg (257 lb 9.6 oz)  - LMP 04/17/2024 (Exact Date)  - BMI 44.97 kg/m??    BMI:(!) 44.97      Exam:  AAO x3  Respiratory:No labored breathing      Plan at initial evaluation on 09/21/2023     1. Based on my assessment of the possible etiologies for the patient's obesity, medical co-morbidities, and review of systems my recommendations include the following:     Diet quality:  Diet is inadequate in protein, inadequate in vegetables, moderate in processed foods and moderate in SSB.     Patient was encouraged to focus on consuming lean protein, a variety of non starchy vegetables and fruit with each meal and snacks. Wean off carbonated and sugar sweetened beverages. Avoid consuming processed and fast foods; and avoid refined carbohydrates ( such as white bread, biscuits, white pasta, white rice, grits, crackers).     Dietary patterns and associated behaviors:  Meal pattern is fairly consistent.     We discussed the importance of having a  consistent meal pattern with three meals per day and 1-3 snacks. Avoid skipping meals or grazing. Avoid going longer than 4 hours without eating.     Patient asked to schedule meals and snacks every 3 hours.     Recommended Premier Protein, Ensure Max, Boost Max and Fair Life Core shakes as a meal replacement to avoid skipping meals. Or choose a protein shake with less than 200 calories, at least 20 grams of protein, less than 10 grams of total carbohydrates and 5 or less of total fat.         Exercise: PA not adequate for age/risk factors      We discussed increasing exercise duration by a few minutes each week until you get to 30 minutes most days of the week.     Then add resistance training by alternating between upper and lower body resistance exercises using bands or free weights 2-3 times a week.     Sleep: STOP-BANG Score 1/8.   Good sleep hygiene. She feels rested most mornings.     Encouraged healthy sleep habits/sleep hygiene such as going to bed when sleepy, trying to get between 7-8 hours per night,  going to bed around the same time nightly and waking around the same time each morning.   Avoid screen time at least one hour before bed.  Avoid drinking at least 2 hours before bed. If your mouth is dry, try small sips to moisten your mouth.      Stress management: Stress level is currently high, feels that it is manageable, and no worrisome sx such as SI/HI, self harm, or worsening of previous psych diagnosis        We will discuss meditation for stress management in the future.     Weight gain causing medications:Depo Provera.      Anti Obesity Medications: A few anti-obesity medications may be used for this patient including metformin, low dose phentermine, topiramate, wellbutrin, GLP1 agonists or SGLT2 inhibitors pending insurance coverage.     Obesity Surgery: Patient is interested in  surgery and does meet criteria for bariatric surgery.  Patient may move forward with bariatric intake appointment in the near future.       Labwork ordered:  HgbA1C, TSH, Vitamin B12, Vitamin D, Folate, Ferritin, Iron  Panel, Lipid panel, Tox screen and Nicotine screen.      Plan on 04/19/2024  Assessment & Plan  Obesity  Management is ongoing with a focus on dietary habits and physical activity. She reports a diet consisting of light breakfast, chicken, rice, and salad for lunch, and irregular dinner habits. No snacks are consumed between meals. Regular soda consumption is noted, with no alcohol or tobacco use. Stress is present but manageable. She lacks routine physical activity due to work schedule. Nutritional goals include eating breakfast, lunch, and dinner without going more than four hours without eating, avoiding drinking during meals, and scheduling physical activity at least once a week. Walking is recommended as feasible exercise.  - Will schedule appointment with bariatric surgeon.  - Practice nutritional goals: eat breakfast, lunch, and dinner without going more than four hours without eating, avoid drinking during meals.  - Schedule walking exercise at least once a week for 15 minutes.    Visit Time  I personally spent 33 minutes face-to-face and non-face-to-face in the care of this patient, which includes all pre, intra, and post visit time on the date of service.  All documented time was specific to the E/M visit and does not include any procedures that may have been performed.

## 2024-04-29 ENCOUNTER — Ambulatory Visit

## 2024-05-13 DIAGNOSIS — M0579 Rheumatoid arthritis with rheumatoid factor of multiple sites without organ or systems involvement: Principal | ICD-10-CM

## 2024-05-13 MED ORDER — HUMIRA PEN CITRATE FREE 40 MG/0.4 ML
SUBCUTANEOUS | 3 refills | 84.00000 days
Start: 2024-05-13 — End: ?

## 2024-05-13 NOTE — Progress Notes (Signed)
 [facility] Refill Coordination Note    Specialty Medication(s) to be Shipped:   Inflammatory Disorders: Humira     Other medication(s) to be shipped: No additional medications requested for fill at this time    Specialty Medications not needed at this time: N/A     Marie Cole, DOB: [birth year]  Phone: There are no phone numbers on file.      All above HIPAA information was verified with patient.     Was a nurse, learning disability used for this call? No    Completed refill call assessment today to schedule patient's medication shipment from the [facility]  [phone]).  All relevant notes have been reviewed.     Specialty medication(s) and dose(s) confirmed: Regimen is correct and unchanged.   Changes to medications: Marie Cole reports no changes at this time.  Changes to insurance: No  New side effects reported not previously addressed with a pharmacist or physician: None reported  Questions for the pharmacist: No    Confirmed patient received a Conservation Officer, Historic Buildings and a Surveyor, Mining with first shipment. The patient will receive a drug information handout for each medication shipped and additional FDA Medication Guides as required.       DISEASE/MEDICATION-SPECIFIC INFORMATION        For patients on injectable medications: Next injection is scheduled for 11/26-asap.    SPECIALTY MEDICATION ADHERENCE     Medication Adherence    Patient reported X missed doses in the last month: 0  Specialty Medication: adalimumab : HUMIRA (CF) PEN 40 mg/0.4 mL injection  Patient is on additional specialty medications: No  Patient is on more than two specialty medications: No  Any gaps in refill history greater than 2 weeks in the last 3 months: no  Demonstrates understanding of importance of adherence: yes  Informant: patient  Reliability of informant: reliable  Provider-estimated medication adherence level: good  Patient is at risk for Non-Adherence: No   Other non-adherence reason: pt was supposed to inject last dose 11/23                 Were doses missed due to medication being on hold? No    Humira  40/0.4 mg/ml: 0 days of medicine on hand       REFERRAL TO PHARMACIST     Referral to the pharmacist: Not needed      [facility]     Shipping address confirmed in Epic.     Cost and Payment: Patient has a $0 copay, payment information is not required.    Delivery Scheduled: Yes, Expected medication delivery date: 11/26.  However, Rx request for refills was sent to the provider as there are none remaining.     Medication will be delivered via Same Day Courier to the prescription address in Epic WAM.    Marie Cole   [facility]  Specialty Technician

## 2024-05-15 NOTE — Progress Notes (Signed)
 Marie Cole 's HUMIRA (CF) PEN 40 mg/0.4 mL injection (adalimumab ) shipment will be delayed as a result of no refills remain on the prescription.      I have reached out to the patient  at [phone] and communicated the delay. We will call the patient back to reschedule the delivery upon resolution. We have not confirmed the new delivery date.

## 2024-05-15 NOTE — Telephone Encounter (Signed)
 Copied from CRM [id number]. Topic: Access To Clinicians - Medication Question  >> May 15, 2024  1:26 PM Karna BIRCH wrote:  They need assistance with their medication(s). Humira  Rx needs to be updated in order to have meds sent to home.     Thanks     Medication Name(s): HUMIRA  PEN CITRATE FREE 40 MG/0.4 ML [[id number]]    Pharmacy: [facility]   [address], [place] [address]         Please contact The patient by Mychart  in regards to this request.    Coverage: yes, coverage is accurate on file.    Medication request turnaround time: 72 business hours. Programmer, Systems Notified)

## 2024-05-15 NOTE — Telephone Encounter (Signed)
 Left message for patient via interpreter [phone]) for patient to call back to discuss further.

## 2024-05-21 NOTE — Telephone Encounter (Signed)
 Patient left second message on nurse voicemail regarding the same issue.

## 2024-05-21 NOTE — Telephone Encounter (Signed)
 Left message for patient to call clinic back to discuss further.

## 2024-05-21 NOTE — Telephone Encounter (Signed)
 Patient left message on nurse line for staff to call back to discuss medication.

## 2024-05-21 NOTE — Telephone Encounter (Signed)
 Left second message with interpreter [phone]) for patient to call clinic back.

## 2024-05-22 MED ORDER — HUMIRA PEN CITRATE FREE 40 MG/0.4 ML
SUBCUTANEOUS | 3 refills | 84.00000 days | Status: CP
Start: 2024-05-22 — End: ?
  Filled 2024-05-27: qty 6, 84d supply, fill #0

## 2024-05-24 NOTE — Progress Notes (Signed)
 Marie Cole 's Humira  shipment will be rescheduled as a result of prior authorization now approved.     I have reached out to the patient  via incoming phone call and communicated the delivery change. We will reschedule the medication for the delivery date that the patient agreed upon.  We have confirmed the delivery date as 05/28/24
